# Patient Record
Sex: Female | Born: 1975 | Race: Black or African American | Hispanic: No | Marital: Single | Smoking: Current some day smoker
Health system: Southern US, Community
[De-identification: ages and names within clinical notes are randomized; demographics above are authoritative.]

## PROBLEM LIST (undated history)

## (undated) ENCOUNTER — Inpatient Hospital Stay (HOSPITAL_COMMUNITY): Payer: Self-pay

## (undated) DIAGNOSIS — R51 Headache: Secondary | ICD-10-CM

## (undated) DIAGNOSIS — A5901 Trichomonal vulvovaginitis: Secondary | ICD-10-CM

## (undated) DIAGNOSIS — A6000 Herpesviral infection of urogenital system, unspecified: Secondary | ICD-10-CM

## (undated) HISTORY — PX: DILATION AND CURETTAGE OF UTERUS: SHX78

---

## 1998-08-25 ENCOUNTER — Emergency Department (HOSPITAL_COMMUNITY): Admission: EM | Admit: 1998-08-25 | Discharge: 1998-08-25 | Payer: Self-pay | Admitting: Emergency Medicine

## 1998-08-31 ENCOUNTER — Inpatient Hospital Stay (HOSPITAL_COMMUNITY): Admission: AD | Admit: 1998-08-31 | Discharge: 1998-08-31 | Payer: Self-pay | Admitting: Obstetrics & Gynecology

## 1998-09-14 ENCOUNTER — Encounter: Payer: Self-pay | Admitting: Obstetrics

## 1998-09-14 ENCOUNTER — Inpatient Hospital Stay (HOSPITAL_COMMUNITY): Admission: AD | Admit: 1998-09-14 | Discharge: 1998-09-14 | Payer: Self-pay | Admitting: Obstetrics & Gynecology

## 1998-11-21 ENCOUNTER — Inpatient Hospital Stay (HOSPITAL_COMMUNITY): Admission: AD | Admit: 1998-11-21 | Discharge: 1998-11-21 | Payer: Self-pay | Admitting: *Deleted

## 1999-04-04 ENCOUNTER — Emergency Department (HOSPITAL_COMMUNITY): Admission: EM | Admit: 1999-04-04 | Discharge: 1999-04-04 | Payer: Self-pay | Admitting: Emergency Medicine

## 2000-10-12 ENCOUNTER — Emergency Department (HOSPITAL_COMMUNITY): Admission: EM | Admit: 2000-10-12 | Discharge: 2000-10-12 | Payer: Self-pay | Admitting: *Deleted

## 2000-10-21 ENCOUNTER — Emergency Department (HOSPITAL_COMMUNITY): Admission: EM | Admit: 2000-10-21 | Discharge: 2000-10-21 | Payer: Self-pay | Admitting: Emergency Medicine

## 2000-11-15 ENCOUNTER — Emergency Department (HOSPITAL_COMMUNITY): Admission: EM | Admit: 2000-11-15 | Discharge: 2000-11-15 | Payer: Self-pay | Admitting: Emergency Medicine

## 2001-01-24 ENCOUNTER — Inpatient Hospital Stay (HOSPITAL_COMMUNITY): Admission: AD | Admit: 2001-01-24 | Discharge: 2001-01-24 | Payer: Self-pay | Admitting: Obstetrics

## 2001-01-24 ENCOUNTER — Encounter: Payer: Self-pay | Admitting: Obstetrics

## 2001-01-24 ENCOUNTER — Encounter (INDEPENDENT_AMBULATORY_CARE_PROVIDER_SITE_OTHER): Payer: Self-pay | Admitting: *Deleted

## 2001-01-24 ENCOUNTER — Ambulatory Visit (HOSPITAL_COMMUNITY): Admission: AD | Admit: 2001-01-24 | Discharge: 2001-01-24 | Payer: Self-pay | Admitting: Obstetrics

## 2001-06-17 ENCOUNTER — Encounter: Payer: Self-pay | Admitting: *Deleted

## 2001-06-17 ENCOUNTER — Inpatient Hospital Stay (HOSPITAL_COMMUNITY): Admission: AD | Admit: 2001-06-17 | Discharge: 2001-06-17 | Payer: Self-pay | Admitting: *Deleted

## 2001-07-27 ENCOUNTER — Inpatient Hospital Stay (HOSPITAL_COMMUNITY): Admission: AD | Admit: 2001-07-27 | Discharge: 2001-07-27 | Payer: Self-pay | Admitting: Obstetrics and Gynecology

## 2001-07-27 ENCOUNTER — Encounter: Payer: Self-pay | Admitting: Obstetrics and Gynecology

## 2001-08-17 ENCOUNTER — Other Ambulatory Visit: Admission: RE | Admit: 2001-08-17 | Discharge: 2001-08-17 | Payer: Self-pay | Admitting: Obstetrics and Gynecology

## 2001-10-18 ENCOUNTER — Inpatient Hospital Stay (HOSPITAL_COMMUNITY): Admission: AD | Admit: 2001-10-18 | Discharge: 2001-10-18 | Payer: Self-pay | Admitting: Obstetrics and Gynecology

## 2001-11-26 ENCOUNTER — Encounter: Payer: Self-pay | Admitting: Obstetrics and Gynecology

## 2001-11-26 ENCOUNTER — Inpatient Hospital Stay (HOSPITAL_COMMUNITY): Admission: AD | Admit: 2001-11-26 | Discharge: 2001-11-26 | Payer: Self-pay | Admitting: Obstetrics and Gynecology

## 2001-12-23 ENCOUNTER — Inpatient Hospital Stay (HOSPITAL_COMMUNITY): Admission: AD | Admit: 2001-12-23 | Discharge: 2001-12-26 | Payer: Self-pay | Admitting: Obstetrics and Gynecology

## 2001-12-24 ENCOUNTER — Encounter (INDEPENDENT_AMBULATORY_CARE_PROVIDER_SITE_OTHER): Payer: Self-pay

## 2002-06-19 ENCOUNTER — Inpatient Hospital Stay (HOSPITAL_COMMUNITY): Admission: AD | Admit: 2002-06-19 | Discharge: 2002-06-19 | Payer: Self-pay | Admitting: Obstetrics and Gynecology

## 2002-06-20 ENCOUNTER — Encounter: Payer: Self-pay | Admitting: Obstetrics and Gynecology

## 2003-01-04 ENCOUNTER — Emergency Department (HOSPITAL_COMMUNITY): Admission: EM | Admit: 2003-01-04 | Discharge: 2003-01-04 | Payer: Self-pay | Admitting: Emergency Medicine

## 2003-02-27 ENCOUNTER — Emergency Department (HOSPITAL_COMMUNITY): Admission: EM | Admit: 2003-02-27 | Discharge: 2003-02-27 | Payer: Self-pay | Admitting: Family Medicine

## 2003-03-03 ENCOUNTER — Ambulatory Visit (HOSPITAL_COMMUNITY): Admission: RE | Admit: 2003-03-03 | Discharge: 2003-03-03 | Payer: Self-pay | Admitting: *Deleted

## 2003-06-05 ENCOUNTER — Emergency Department (HOSPITAL_COMMUNITY): Admission: EM | Admit: 2003-06-05 | Discharge: 2003-06-05 | Payer: Self-pay | Admitting: Emergency Medicine

## 2003-08-12 ENCOUNTER — Inpatient Hospital Stay (HOSPITAL_COMMUNITY): Admission: AD | Admit: 2003-08-12 | Discharge: 2003-08-12 | Payer: Self-pay | Admitting: Obstetrics and Gynecology

## 2003-10-01 ENCOUNTER — Inpatient Hospital Stay (HOSPITAL_COMMUNITY): Admission: AD | Admit: 2003-10-01 | Discharge: 2003-10-01 | Payer: Self-pay | Admitting: *Deleted

## 2003-10-06 ENCOUNTER — Emergency Department (HOSPITAL_COMMUNITY): Admission: EM | Admit: 2003-10-06 | Discharge: 2003-10-06 | Payer: Self-pay | Admitting: Family Medicine

## 2004-01-11 ENCOUNTER — Emergency Department (HOSPITAL_COMMUNITY): Admission: EM | Admit: 2004-01-11 | Discharge: 2004-01-11 | Payer: Self-pay | Admitting: Family Medicine

## 2004-07-13 ENCOUNTER — Emergency Department (HOSPITAL_COMMUNITY): Admission: EM | Admit: 2004-07-13 | Discharge: 2004-07-13 | Payer: Self-pay | Admitting: Emergency Medicine

## 2005-07-08 ENCOUNTER — Other Ambulatory Visit: Admission: RE | Admit: 2005-07-08 | Discharge: 2005-07-08 | Payer: Self-pay | Admitting: Obstetrics and Gynecology

## 2005-12-16 ENCOUNTER — Inpatient Hospital Stay (HOSPITAL_COMMUNITY): Admission: AD | Admit: 2005-12-16 | Discharge: 2005-12-18 | Payer: Self-pay | Admitting: Obstetrics and Gynecology

## 2006-12-05 ENCOUNTER — Inpatient Hospital Stay (HOSPITAL_COMMUNITY): Admission: AD | Admit: 2006-12-05 | Discharge: 2006-12-05 | Payer: Self-pay | Admitting: Obstetrics & Gynecology

## 2006-12-17 ENCOUNTER — Inpatient Hospital Stay (HOSPITAL_COMMUNITY): Admission: AD | Admit: 2006-12-17 | Discharge: 2006-12-17 | Payer: Self-pay | Admitting: Obstetrics & Gynecology

## 2007-11-22 ENCOUNTER — Inpatient Hospital Stay (HOSPITAL_COMMUNITY): Admission: AD | Admit: 2007-11-22 | Discharge: 2007-11-22 | Payer: Self-pay | Admitting: Obstetrics & Gynecology

## 2009-02-25 ENCOUNTER — Emergency Department (HOSPITAL_COMMUNITY): Admission: EM | Admit: 2009-02-25 | Discharge: 2009-02-25 | Payer: Self-pay | Admitting: Family Medicine

## 2009-08-19 ENCOUNTER — Emergency Department (HOSPITAL_COMMUNITY): Admission: EM | Admit: 2009-08-19 | Discharge: 2009-08-19 | Payer: Self-pay | Admitting: Emergency Medicine

## 2010-04-03 ENCOUNTER — Encounter: Payer: Self-pay | Admitting: *Deleted

## 2010-07-30 NOTE — Discharge Summary (Signed)
   NAMEVIRJEAN, Jamie Browning                             ACCOUNT NO.:  [id number]   MEDICAL RECORD NO.:  [id number]                   PATIENT TYPE:  INP   LOCATION:  9143                                 [facility]ILITY:  [facility]   PHYSICIAN:  James A. Ashley Royalty, M.D.             DATE OF BIRTH:  1975/09/21   DATE OF ADMISSION:  12/23/2001  DATE OF DISCHARGE:  12/26/2001                                 DISCHARGE SUMMARY   DISCHARGE DIAGNOSES:  1. Intrauterine pregnancy at 37-1/2 weeks' gestation.  2. Premature rupture of membranes.  3. Positive group B streptococcus status.  4. ________________ vertex.   OPERATION/SPECIAL PROCEDURES:  OB delivery.   CONSULTATIONS:  None.   DISCHARGE MEDICATIONS:  Motrin.   HISTORY AND PHYSICAL:  This is a 35 year old gravida 6 para 2-0-3-2 37-1/2  weeks' gestation.  Prenatal care was complicated by a positive group B  streptococcus culture and noncompliance with OB care.  The patient presented  with premature rupture of membranes at 6 p.m. the previous night.  She came  spontaneously to maternity admissions at 8 p.m.  Gross rupture of membranes  was documented.  For remainder of the history and physical, please see  chart.   HOSPITAL COURSE:  The patient was admitted to [facility] of  [place].  Admission laboratory studies were drawn.  The initial cervical  examination by the R.N. revealed 1 cm dilatation, long and high.  Intrauterine pressure catheter was subsequently placed and Pitocin infused.  The patient went on to labor and deliver on December 24, 2001.  Infant was a  6 pound 2 ounce female.  Apgars were 8 at one minute, 9 at five minutes,  sent to the newborn nursery.  Delivery was uncomplicated.  There was a  prolonged third stage of labor necessitating manual removal of the placenta.  The delivery was accomplished over an intact perineum.  There were no  lacerations.  Postpartum course was benign.  The patient was discharged on  the same  postpartum day, afebrile and in satisfactory condition.   ACCESSORY CLINICAL FINDINGS:  Hemoglobin and hematocrit on admission were  9.4 and 28.5, respectively.  Repeat values obtained December 26, 2001, were  8.1 and 24.1, respectively.   DISPOSITION:  The patient is to return to the office gynecology in four to  six weeks for postpartum evaluation.                                                James A. Ashley Royalty, M.D.    JAM/MEDQ  D:  03/06/2002  T:  03/07/2002  Job:  [id number]

## 2010-07-30 NOTE — Op Note (Signed)
[facility]  Patient:    Jamie Browning, Jamie Browning Visit Number: [id number] MRN: [id number]          Service Type: OBS Location: [facility] Attending Physician:  Tammi Sou Dictated by:   Bing Neighbors Clearance Coots, M.D. Proc. Date: 01/24/01 Admit Date:  01/24/2001                             Operative Report  PREOPERATIVE DIAGNOSIS:       First trimester spontaneous abortion, incomplete.  POSTOPERATIVE DIAGNOSIS:      First trimester spontaneous abortion, incomplete.  PROCEDURE:                    Suction evacuation and curettage.  SURGEON:                      Charles A. Clearance Coots, M.D.  ANESTHESIA:                   MAC with paracervical block.  ESTIMATED BLOOD LOSS:         100 ml.  COMPLICATIONS:                None.  SPECIMENS:                    Products of conception.  DESCRIPTION OF PROCEDURE:     The patient was brought to the operating room and, after satisfactory IV sedation, the legs were brought up in stirrups and the vagina was prepped and draped in the usual sterile fashion.  The urinary bladder was emptied of approximately 50 ml of clear urine.  Bimanual examination revealed the uterus to be midposition and approximately 11 weeks in size.  The cervix was open.  A sterile speculum was inserted in the vaginal vault and the cervix was isolated.  Products of conception could be seen extruding from the cervical os.  The anterior lip of the cervix was grasped with a ring forceps.  Paracervical block of approximately 20 ml of 2% Xylocaine was injected in each lateral fornix, with 10 ml in each lateral fornix for a total of 20 ml at the 3 and 9 oclock positions.  A #9 suction catheter was then easily introduced into the uterine cavity and all contents were evacuated.  The endometrial surface was then thoroughly curetted with a medium sharp curet, and no further products of conception were obtained.  The endometrial surface felt gritty all the  way around at the conclusion of the procedure.  The uterus contracted down quite well.  There was no active bleeding at the conclusion of the procedure.  All instruments were retired. The patient tolerated the procedure well and was transported to the recovery room in satisfactory condition. Dictated by:   Bing Neighbors Clearance Coots, M.D. Attending Physician:  Tammi Sou DD:  01/25/01 TD:  01/25/01 Job: [id number]
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
## 2010-12-15 LAB — GC/CHLAMYDIA PROBE AMP, GENITAL: GC Probe Amp, Genital: NEGATIVE

## 2010-12-15 LAB — WET PREP, GENITAL: Yeast Wet Prep HPF POC: NONE SEEN

## 2010-12-23 LAB — WET PREP, GENITAL
Clue Cells Wet Prep HPF POC: NONE SEEN
Yeast Wet Prep HPF POC: NONE SEEN

## 2010-12-23 LAB — GC/CHLAMYDIA PROBE AMP, GENITAL
Chlamydia, DNA Probe: NEGATIVE
Chlamydia, DNA Probe: NEGATIVE
GC Probe Amp, Genital: NEGATIVE

## 2010-12-23 LAB — URINALYSIS, ROUTINE W REFLEX MICROSCOPIC
Glucose, UA: NEGATIVE
Glucose, UA: NEGATIVE
Hgb urine dipstick: NEGATIVE
Ketones, ur: NEGATIVE
Leukocytes, UA: NEGATIVE
Protein, ur: NEGATIVE
Urobilinogen, UA: 1
pH: 8.5 — ABNORMAL HIGH

## 2010-12-23 LAB — URINE MICROSCOPIC-ADD ON

## 2010-12-23 LAB — CBC
HCT: 35.8 — ABNORMAL LOW
Hemoglobin: 12.4
MCHC: 35
MCV: 88.5
Platelets: 304
RDW: 13.1
RDW: 13.5

## 2010-12-23 LAB — POCT PREGNANCY, URINE
Operator id: 242691
Preg Test, Ur: POSITIVE

## 2010-12-23 LAB — HERPES SIMPLEX VIRUS CULTURE: Culture: DETECTED

## 2011-08-04 ENCOUNTER — Encounter (HOSPITAL_COMMUNITY): Payer: Self-pay | Admitting: Emergency Medicine

## 2011-08-04 ENCOUNTER — Emergency Department (INDEPENDENT_AMBULATORY_CARE_PROVIDER_SITE_OTHER)
Admission: EM | Admit: 2011-08-04 | Discharge: 2011-08-04 | Disposition: A | Discharge status: Home / Self Care | Payer: Medicaid Other | Source: Home / Self Care | Attending: Emergency Medicine | Admitting: Emergency Medicine

## 2011-08-04 ENCOUNTER — Emergency Department (INDEPENDENT_AMBULATORY_CARE_PROVIDER_SITE_OTHER): Payer: Medicaid Other

## 2011-08-04 DIAGNOSIS — S93409A Sprain of unspecified ligament of unspecified ankle, initial encounter: Secondary | ICD-10-CM

## 2011-08-04 MED ORDER — DICLOFENAC SODIUM 75 MG PO TBEC: 75.0000 mg | DELAYED_RELEASE_TABLET | Freq: Two times a day (BID) | ORAL | Status: DC

## 2011-08-04 NOTE — Discharge Instructions (Signed)
With discharge instructions. If pain persists after 7 days with weightbearing activities should return or followup with orthopedic Dr. Markus Jarvis
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
## 2011-08-08 ENCOUNTER — Emergency Department (HOSPITAL_COMMUNITY)
Admission: EM | Admit: 2011-08-08 | Discharge: 2011-08-08 | Disposition: A | Discharge status: Home / Self Care | Payer: Medicaid Other | Attending: Emergency Medicine | Admitting: Emergency Medicine

## 2011-08-08 ENCOUNTER — Encounter (HOSPITAL_COMMUNITY): Payer: Self-pay | Admitting: *Deleted

## 2011-08-08 DIAGNOSIS — M549 Dorsalgia, unspecified: Secondary | ICD-10-CM | POA: Insufficient documentation

## 2011-08-08 MED ORDER — IBUPROFEN 800 MG PO TABS
800.0000 mg | ORAL_TABLET | Freq: Once | ORAL | Status: AC
  Administered 2011-08-08: 800 mg via ORAL
  Filled 2011-08-08: qty 1

## 2011-08-08 MED ORDER — IBUPROFEN 800 MG PO TABS: 800.0000 mg | ORAL_TABLET | Freq: Three times a day (TID) | ORAL | Status: DC

## 2011-08-08 MED ORDER — CYCLOBENZAPRINE HCL 10 MG PO TABS
10.0000 mg | ORAL_TABLET | Freq: Once | ORAL | Status: AC
  Administered 2011-08-08: 10 mg via ORAL
  Filled 2011-08-08: qty 1

## 2011-08-08 MED ORDER — CYCLOBENZAPRINE HCL 10 MG PO TABS: 10.0000 mg | ORAL_TABLET | Freq: Two times a day (BID) | ORAL | Status: DC | PRN
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
## 2011-08-08 NOTE — Discharge Instructions (Signed)
Jamie Browning the pain you are having is coming from muscle pain. Use ice on the areas intermittently. Use ibuprofen 800 mg every 6 hours with 2x24 hours. For severe pain used to muscle relaxers but do not drive with this medication.  Follow up with a pcp from the list below.  Return if you have bowel or bladder problems or high fever.  Back Pain, Adult Back pain is very common. The pain often gets better over time. The cause of back pain is usually not dangerous. Most people can learn to manage their back pain on their own.  HOME CARE   Stay active. Start with short walks on flat ground if you can. Try to walk farther each day.   Do not sit, drive, or stand in one place for more than 30 minutes. Do not stay in bed.   Do not avoid exercise or work. Activity can help your back heal faster.   Be careful when you bend or lift an object. Bend at your knees, keep the object close to you, and do not twist.   Sleep on a firm mattress. Lie on your side, and bend your knees. If you lie on your back, put a pillow under your knees.   Only take medicines as told by your doctor.   Put ice on the injured area.   Put ice in a plastic bag.   Place a towel between your skin and the bag.   Leave the ice on for 15 to 20 minutes, 3 to 4 times a day for the first 2 to 3 days. After that, you can switch between ice and heat packs.   Ask your doctor about back exercises or massage.   Avoid feeling anxious or stressed. Find good ways to deal with stress, such as exercise.  GET HELP RIGHT AWAY IF:   Your pain does not go away with rest or medicine.   Your pain does not go away in 1 week.   You have new problems.   You do not feel well.   The pain spreads into your legs.   You cannot control when you poop (bowel movement) or pee (urinate).   Your arms or legs feel weak or lose feeling (numbness).   You feel sick to your stomach (nauseous) or throw up (vomit).   You have belly (abdominal) pain.    You feel like you may pass out (faint).  MAKE SURE YOU:   Understand these instructions.   Will watch your condition.   Will get help right away if you are not doing well or get worse.  Document Released: 08/17/2007 Document Revised: 02/17/2011 Document Reviewed: 07/19/2010 [facility] Patient Information 2012 [place], [place].Back Pain, Adult Back pain is very common. The pain often gets better over time. The cause of back pain is usually not dangerous. Most people can learn to manage their back pain on their own.  HOME CARE   Stay active. Start with short walks on flat ground if you can. Try to walk farther each day.   Do not sit, drive, or stand in one place for more than 30 minutes. Do not stay in bed.   Do not avoid exercise or work. Activity can help your back heal faster.   Be careful when you bend or lift an object. Bend at your knees, keep the object close to you, and do not twist.   Sleep on a firm mattress. Lie on your side, and bend your knees. If you lie  on your back, put a pillow under your knees.   Only take medicines as told by your doctor.   Put ice on the injured area.   Put ice in a plastic bag.   Place a towel between your skin and the bag.   Leave the ice on for 15 to 20 minutes, 3 to 4 times a day for the first 2 to 3 days. After that, you can switch between ice and heat packs.   Ask your doctor about back exercises or massage.   Avoid feeling anxious or stressed. Find good ways to deal with stress, such as exercise.  GET HELP RIGHT AWAY IF:   Your pain does not go away with rest or medicine.   Your pain does not go away in 1 week.   You have new problems.   You do not feel well.   The pain spreads into your legs.   You cannot control when you poop (bowel movement) or pee (urinate).   Your arms or legs feel weak or lose feeling (numbness).   You feel sick to your stomach (nauseous) or throw up (vomit).   You have belly (abdominal) pain.    You feel like you may pass out (faint).  MAKE SURE YOU:   Understand these instructions.   Will watch your condition.   Will get help right away if you are not doing well or get worse.  Document Released: 08/17/2007 Document Revised: 02/17/2011 Document Reviewed: 07/19/2010 [facility] Patient Information 2012 [place], [place].Back Exercises Back exercises help treat and prevent back injuries. The goal is to increase your strength in your belly (abdominal) and back muscles. These exercises can also help with flexibility. Start these exercises when told by your doctor. HOME CARE Back exercises include: Pelvic Tilt.  Lie on your back with your knees bent. Tilt your pelvis until the lower part of your back is against the floor. Hold this position 5 to 10 sec. Repeat this exercise 5 to 10 times.  Knee to Chest.  Pull 1 knee up against your chest and hold for 20 to 30 seconds. Repeat this with the other knee. This may be done with the other leg straight or bent, whichever feels better. Then, pull both knees up against your chest.  Sit-Ups or Curl-Ups.  Bend your knees 90 degrees. Start with tilting your pelvis, and do a partial, slow sit-up. Only lift your upper half 30 to 45 degrees off the floor. Take at least 2 to 3 seonds for each sit-up. Do not do sit-ups with your knees out straight. If partial sit-ups are difficult, simply do the above but with only tightening your belly (abdominal) muscles and holding it as told.  Hip-Lift.  Lie on your back with your knees flexed 90 degrees. Push down with your feet and shoulders as you raise your hips 2 inches off the floor. Hold for 10 seconds, repeat 5 to 10 times.  Back Arches.  Lie on your stomach. Prop yourself up on bent elbows. Slowly press on your hands, causing an arch in your low back. Repeat 3 to 5 times.  Shoulder-Lifts.  Lie face down with arms beside your body. Keep hips and belly pressed to floor as you slowly lift your head and  shoulders off the floor.  Do not overdo your exercises. Be careful in the beginning. Exercises may cause you some mild back discomfort. If the pain lasts for more than 15 minutes, stop the exercises until you see your doctor. Improvement  with exercise for back problems is slow.  Document Released: 04/02/2010 Document Revised: 02/17/2011 Document Reviewed: 04/02/2010 [facility] Patient Information 2012 [place], [place].  RESOURCE GUIDE  Dental Problems  Patients with Medicaid: [facility] [phone] [address]                                           [id number] [employer] [address] Phone:  [phone]                                                  Phone:  [phone]  If unable to pay or uninsured, contact:  [facility] or [facility]. to become qualified for the adult dental clinic.  Chronic Pain Problems Contact [facility]  [phone] Patients need to be referred by their primary care doctor.  Insufficient Money for Medicine Contact [employer]:  call "211" or [facility] 8577818084.  No Primary Care Doctor Call [facility]  [phone] Other agencies that provide inexpensive medical care    [facility]  [phone]    [facility]  [phone]    [facility]  [phone]    [facility]  [phone]    Planned Parenthood  [phone]    [facility]  [phone]  Psychological Services [facility]  [phone] [facility] Services  [phone] [facility]   [phone] (emergency services [phone])  Substance Abuse Resources Alcohol and Drug Services  612-210-3067 [facility] [phone] [facility] [phone] Floydene Flock [phone] Residential & Outpatient Substance Abuse Program  [phone]  Abuse/Neglect [facility] Child Abuse Hotline 4752139654 [facility] Child Abuse Hotline (325)761-0956  (After Hours)  Emergency Shelter [facility] Ministries [phone]  Maternity Homes Room at the [place] [facility] [place] 203-688-4185 [facility] 2056309304  MRSA Hotline #:   612-712-3209    [facility] Resources  [facility]     [employer]                          [facility] Dept. [address] [place]                       [address]      [address]  [place]                                                Cristobal Goldmann Phone:  [phone]                                   Phone:  [phone]  Phone:  [phone]  [facility] Mental Health Phone:  [phone]  [facility] Child Abuse Hotline 561-107-2488 [phone] (After Hours)

## 2011-08-11 NOTE — ED Provider Notes (Signed)
History     CSN: [id number]  Arrival date & time 08/08/11  8119   First MD Initiated Contact with Patient 08/08/11 1038      Chief Complaint  Patient presents with  . Back Pain    (Consider location/radiation/quality/duration/timing/severity/associated sxs/prior treatment) HPI  History reviewed. No pertinent past medical history.  History reviewed. No pertinent past surgical history.  No family history on file.  History  Substance Use Topics  . Smoking status: Never Smoker   . Smokeless tobacco: Not on file  . Alcohol Use: Yes    OB History    Grav Para Term Preterm Abortions TAB SAB Ect Mult Living                  Review of Systems  Allergies  Review of patient's allergies indicates no known allergies.  Home Medications   Current Outpatient Rx  Name Route Sig Dispense Refill  . DICLOFENAC SODIUM 75 MG PO TBEC Oral Take 75 mg by mouth 2 (two) times daily.    . IBUPROFEN 200 MG PO TABS Oral Take 400 mg by mouth every 6 (six) hours as needed. For pain    . CYCLOBENZAPRINE HCL 10 MG PO TABS Oral Take 1 tablet (10 mg total) by mouth 2 (two) times daily as needed for muscle spasms. 20 tablet 0  . IBUPROFEN 800 MG PO TABS Oral Take 1 tablet (800 mg total) by mouth 3 (three) times daily. 21 tablet 0    BP 115/67  Pulse 70  Temp(Src) 98.3 F (36.8 C) (Oral)  Resp 16  SpO2 98%  Physical Exam  ED Course  Procedures (including critical care time)  Labs Reviewed - No data to display No results found.   1. Back pain       MDM  Please see NP Crawford note for 5/27 ed visit        Suzi Roots, MD 08/11/11 [phone]

## 2011-08-13 ENCOUNTER — Encounter (HOSPITAL_COMMUNITY): Payer: Self-pay | Admitting: *Deleted

## 2011-08-13 ENCOUNTER — Emergency Department (HOSPITAL_COMMUNITY)
Admission: EM | Admit: 2011-08-13 | Discharge: 2011-08-14 | Disposition: A | Discharge status: Home / Self Care | Payer: Medicaid Other | Attending: Emergency Medicine | Admitting: Emergency Medicine

## 2011-08-13 DIAGNOSIS — T22139A Burn of first degree of unspecified upper arm, initial encounter: Secondary | ICD-10-CM | POA: Insufficient documentation

## 2011-08-13 DIAGNOSIS — X088XXA Exposure to other specified smoke, fire and flames, initial encounter: Secondary | ICD-10-CM | POA: Insufficient documentation

## 2011-08-13 DIAGNOSIS — T23109A Burn of first degree of unspecified hand, unspecified site, initial encounter: Secondary | ICD-10-CM | POA: Insufficient documentation

## 2011-08-13 DIAGNOSIS — T310 Burns involving less than 10% of body surface: Secondary | ICD-10-CM | POA: Insufficient documentation

## 2011-08-13 MED ORDER — TETANUS-DIPHTH-ACELL PERTUSSIS 5-2.5-18.5 LF-MCG/0.5 IM SUSP
0.5000 mL | Freq: Once | INTRAMUSCULAR | Status: AC
Start: 2011-08-13 — End: 2011-08-13
  Administered 2011-08-13: 0.5 mL via INTRAMUSCULAR
  Filled 2011-08-13: qty 0.5

## 2011-08-13 MED ORDER — HYDROCODONE-ACETAMINOPHEN 7.5-500 MG/15ML PO SOLN: 7.5000 mL | Freq: Four times a day (QID) | ORAL | Status: AC | PRN

## 2011-08-13 MED ORDER — SILVER SULFADIAZINE 1 % EX CREA
TOPICAL_CREAM | Freq: Once | CUTANEOUS | Status: AC
  Administered 2011-08-14: via TOPICAL
  Filled 2011-08-13: qty 85

## 2011-08-13 MED ORDER — SILVER SULFADIAZINE 1 % EX CREA: TOPICAL_CREAM | Freq: Every day | CUTANEOUS | Status: DC
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
## 2011-08-13 NOTE — Discharge Instructions (Signed)

## 2011-11-21 NOTE — ED Provider Notes (Signed)
History     CSN: [id number]  Arrival date & time 08/08/11  5784   First MD Initiated Contact with Patient 08/08/11 1038      Chief Complaint  Patient presents with  . Back Pain    (Consider location/radiation/quality/duration/timing/severity/associated sxs/prior treatment) Patient is a 36 y.o. female presenting with back pain. The history is provided by the patient. No language interpreter was used.  Back Pain  This is a new problem. The current episode started more than 2 days ago. The problem occurs daily. The problem has been gradually worsening. The pain is associated with no known injury. Pain location: paraspinal lumbar L. The quality of the pain is described as aching. The pain does not radiate. The pain is at a severity of 7/10. The pain is moderate. The symptoms are aggravated by bending and certain positions. Pertinent negatives include no fever, no numbness, no bowel incontinence, no perianal numbness, no bladder incontinence, no dysuria, no leg pain, no paresthesias, no paresis, no tingling and no weakness. She has tried analgesics for the symptoms. The treatment provided mild relief.    History reviewed. No pertinent past medical history.  History reviewed. No pertinent past surgical history.  No family history on file.  History  Substance Use Topics  . Smoking status: Never Smoker   . Smokeless tobacco: Not on file  . Alcohol Use: Yes    OB History    Grav Para Term Preterm Abortions TAB SAB Ect Mult Living                  Review of Systems  Constitutional: Negative.  Negative for fever.  HENT: Negative.   Eyes: Negative.   Respiratory: Negative.   Cardiovascular: Negative.   Gastrointestinal: Negative.  Negative for nausea, vomiting and bowel incontinence.  Genitourinary: Negative for bladder incontinence, dysuria, urgency, frequency, hematuria, flank pain and difficulty urinating.  Musculoskeletal: Positive for back pain. Negative for gait problem.    Neurological: Negative.  Negative for tingling, weakness, numbness and paresthesias.  Psychiatric/Behavioral: Negative.   All other systems reviewed and are negative.    Allergies  Review of patient's allergies indicates no known allergies.  Home Medications   Current Outpatient Rx  Name Route Sig Dispense Refill  . DICLOFENAC SODIUM 75 MG PO TBEC Oral Take 75 mg by mouth 2 (two) times daily as needed. For pain    . SILVER SULFADIAZINE 1 % EX CREA Topical Apply topically daily. 50 g 0    BP 115/67  Pulse 70  Temp 98.3 F (36.8 C) (Oral)  Resp 16  SpO2 98%  Physical Exam  Nursing note and vitals reviewed. Constitutional: She is oriented to person, place, and time. She appears well-developed and well-nourished.  HENT:  Head: Normocephalic and atraumatic.  Eyes: Conjunctivae normal and EOM are normal. Pupils are equal, round, and reactive to light.  Neck: Normal range of motion. Neck supple.  Cardiovascular: Normal rate.   Pulmonary/Chest: Effort normal.  Abdominal: Soft.  Musculoskeletal: Normal range of motion. She exhibits tenderness. She exhibits no edema.       L paraspinal lumbar tenderness  Neurological: She is alert and oriented to person, place, and time. She has normal reflexes.  Skin: Skin is warm and dry.  Psychiatric: She has a normal mood and affect.    ED Course  Procedures (including critical care time)  Labs Reviewed - No data to display No results found.   1. Back pain       MDM  L lower paraspinal pain x 3 days.  Better after ibuprofen/ice and flexeril in the ER.  RX for the same.  No sciatica, red flags or cauda equina symptoms.  Will followup with pcp of choice.  Return to the ER for worsening symptoms.       Remi Haggard, NP 11/25/11 1152

## 2011-11-26 NOTE — ED Provider Notes (Signed)
Medical screening examination/treatment/procedure(s) were performed by non-physician practitioner and as supervising physician I was immediately available for consultation/collaboration.   Katriona Schmierer E Benedicta Sultan, MD 11/26/11 1439 

## 2012-05-17 ENCOUNTER — Inpatient Hospital Stay (HOSPITAL_COMMUNITY)
Admission: AD | Admit: 2012-05-17 | Discharge: 2012-05-17 | Disposition: A | Discharge status: Home / Self Care | Payer: Medicaid Other | Source: Ambulatory Visit | Attending: Obstetrics & Gynecology | Admitting: Obstetrics & Gynecology

## 2012-05-17 ENCOUNTER — Encounter (HOSPITAL_COMMUNITY): Payer: Self-pay | Admitting: *Deleted

## 2012-05-17 ENCOUNTER — Inpatient Hospital Stay (HOSPITAL_COMMUNITY): Payer: Medicaid Other

## 2012-05-17 DIAGNOSIS — N938 Other specified abnormal uterine and vaginal bleeding: Secondary | ICD-10-CM | POA: Insufficient documentation

## 2012-05-17 HISTORY — DX: Headache: R51

## 2012-05-17 LAB — CBC
Hemoglobin: 11.7 g/dL — ABNORMAL LOW (ref 12.0–15.0)
MCHC: 32.7 g/dL (ref 30.0–36.0)
RBC: 4.2 MIL/uL (ref 3.87–5.11)

## 2012-05-17 LAB — POCT PREGNANCY, URINE: Preg Test, Ur: NEGATIVE

## 2012-05-17 MED ORDER — ESTROGENS CONJUGATED 0.3 MG PO TABS: 0.3000 mg | ORAL_TABLET | Freq: Every day | ORAL | Status: DC

## 2012-05-17 NOTE — MAU Provider Note (Signed)
History     CSN: [id number]  Arrival date and time: 05/17/12 1032   First Provider Initiated Contact with Patient 05/17/12 1124      Chief Complaint  Patient presents with  . Vaginal Bleeding   HPI This is a 37 y.o. female who presents with c/o irregular bleeding and spotting since being on DepoProvera. Has been on it for ayear and has had spotting throughout. Last dose was in January.  Denies major problems, just aggravated with spotting. Worried she has lost too much blood and is worried she has fibroids.  RN Note: Patient states she has had irregular bleeding for over a year while taking Depo. Last Depo was in January. States she bleeds some almost every day. Occasionally has cramping but none at this time.       OB History   Grav Para Term Preterm Abortions TAB SAB Ect Mult Living   7 4 4  3 2 1   4       Past Medical History  Diagnosis Date  . Headache     Past Surgical History  Procedure Laterality Date  . Dilation and curettage of uterus      History reviewed. No pertinent family history.  History  Substance Use Topics  . Smoking status: Never Smoker   . Smokeless tobacco: Not on file  . Alcohol Use: Yes     Comment: occasional    Allergies: No Known Allergies  Prescriptions prior to admission  Medication Sig Dispense Refill  . medroxyPROGESTERone (DEPO-PROVERA) 150 MG/ML injection Inject 150 mg into the muscle every 3 (three) months.      Marland Kitchen OVER THE COUNTER MEDICATION Take 2-4 capsules by mouth daily. Herbal supplement for weight loss      . OVER THE COUNTER MEDICATION Apply 1 application topically daily. Herbal enhancement cream        Review of Systems  Constitutional: Negative for fever, chills and malaise/fatigue.  Cardiovascular: Negative for chest pain.  Gastrointestinal: Negative for nausea, vomiting, abdominal pain, diarrhea and constipation.  Genitourinary: Negative for dysuria.  Neurological: Negative for dizziness.   Physical Exam    Blood pressure 128/73, pulse 70, temperature 98 F (36.7 C), temperature source Oral, resp. rate 20.  Physical Exam  Constitutional: She is oriented to person, place, and time. She appears well-developed and well-nourished. No distress.  Cardiovascular: Normal rate.   Respiratory: Effort normal.  GI: Soft. She exhibits no distension and no mass. There is no tenderness. There is no rebound and no guarding.  Genitourinary: Vagina normal and uterus normal. No vaginal discharge found.  Uterus small and nontender   Musculoskeletal: Normal range of motion.  Neurological: She is alert and oriented to person, place, and time.  Skin: Skin is warm and dry.  Psychiatric: She has a normal mood and affect.   Declines STD testing  MAU Course  Procedures  MDM US Transvaginal Non-ob  05/17/2012  *RADIOLOGY REPORT*  Clinical Data: Dysfunctional uterine bleeding. Depo-Provera administered 03/2012.  Vaginal bleeding every day per patient.  TRANSABDOMINAL AND TRANSVAGINAL ULTRASOUND OF PELVIS Technique:  Both transabdominal and transvaginal ultrasound examinations of the pelvis were performed. Transabdominal technique was performed for global imaging of the pelvis including uterus, ovaries, adnexal regions, and pelvic cul-de-sac.  It was necessary to proceed with endovaginal exam following the transabdominal exam to visualize the endometrium and adnexa.  Comparison:  None  Findings:  Uterus: Is anteverted and anteflexed and demonstrates a sagittal length of 9.6 cm, depth of 5.4 cm and  width of 7.5 cm.  A small focal fibroid is identified in the anterior midbody measuring 8 x 7 x 9 mm.  No other focal mural abnormalities are seen.  Endometrium: Appears thin and echogenic with a width of 2 mm.  A trace of fluid is noted in the fundal portion of the endometrial canal.  No areas of focal thickening or heterogeneity are seen.  Right ovary:  Has a normal appearance measuring 3.1 x 2.2 x 2.1 cm  Left ovary: Has a  normal appearance measuring 2.7 x 1.5 x 1.7 cm  Other findings: No pelvic fluid or separate adnexal masses are seen.  IMPRESSION: Small focal mural fibroid with sizes location as noted above. Normal endometrial stripe and ovaries.   Original Report Authenticated By: Rhodia Albright, M.D.     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 Biopsy since we know cause of bleeding       Will get appt in clnic for long term plans    Medication List    TAKE these medications       estrogens (conjugated) 0.3 MG tablet  Commonly known as:  PREMARIN  Take 1 tablet (0.3 mg total) by mouth daily. Take daily for 21 days then do not take for 7 days.     medroxyPROGESTERone 150 MG/ML injection  Commonly known as:  DEPO-PROVERA  Inject 150 mg into the muscle every 3 (three) months.     OVER THE COUNTER MEDICATION  Take 2-4 capsules by mouth daily. Herbal supplement for weight loss     OVER THE COUNTER MEDICATION  Apply 1 application topically daily. Herbal enhancement cream         Ottis Vacha 05/17/2012, 2:09 PM

## 2012-05-17 NOTE — MAU Note (Signed)
Patient states she has had irregular bleeding for over a year while taking Depo. Last Depo was in January. States she bleeds some almost every day. Occasionally has cramping but none at this time.

## 2012-05-17 NOTE — MAU Note (Signed)
Pt gets her depo @ [facility] in [place].  Last injection was end of January.

## 2012-05-21 NOTE — MAU Provider Note (Signed)
Attestation of Attending Supervision of Advanced Practitioner (CNM/NP): Evaluation and management procedures were performed by the Advanced Practitioner under my supervision and collaboration.  I have reviewed the Advanced Practitioner's note and chart, and I agree with the management and plan.  HARRAWAY-SMITH, CAROLYN 10:53 AM     

## 2013-05-22 ENCOUNTER — Inpatient Hospital Stay (HOSPITAL_COMMUNITY)
Admission: AD | Admit: 2013-05-22 | Discharge: 2013-05-22 | Discharge status: Against Medical Advice | Payer: Medicaid Other | Source: Ambulatory Visit | Attending: Obstetrics & Gynecology | Admitting: Obstetrics & Gynecology

## 2013-05-22 DIAGNOSIS — Z5320 Procedure and treatment not carried out because of patient's decision for unspecified reasons: Secondary | ICD-10-CM | POA: Insufficient documentation

## 2013-05-22 DIAGNOSIS — N938 Other specified abnormal uterine and vaginal bleeding: Secondary | ICD-10-CM | POA: Insufficient documentation

## 2013-05-22 DIAGNOSIS — N949 Unspecified condition associated with female genital organs and menstrual cycle: Secondary | ICD-10-CM | POA: Insufficient documentation

## 2013-05-22 NOTE — MAU Note (Signed)
Patient states she thinks she might have a tampon in for about one week. Patient states she is 2 weeks late for a Depo injection, [facility] but does not want to take it again. State she has bleeding all the time. States she was supposed to be called for an appointment for birth control pills but never got a call.

## 2013-05-22 NOTE — MAU Note (Signed)
Patient informed admission staff that she could not wait and left. State she will return this afternoon.

## 2013-05-25 ENCOUNTER — Encounter (HOSPITAL_COMMUNITY): Payer: Self-pay | Admitting: *Deleted

## 2013-05-25 ENCOUNTER — Inpatient Hospital Stay (HOSPITAL_COMMUNITY)
Admission: AD | Admit: 2013-05-25 | Discharge: 2013-05-25 | Disposition: A | Discharge status: Home / Self Care | Payer: Medicaid Other | Source: Ambulatory Visit | Attending: Obstetrics & Gynecology | Admitting: Obstetrics & Gynecology

## 2013-05-25 DIAGNOSIS — F17200 Nicotine dependence, unspecified, uncomplicated: Secondary | ICD-10-CM | POA: Insufficient documentation

## 2013-05-25 DIAGNOSIS — N760 Acute vaginitis: Secondary | ICD-10-CM

## 2013-05-25 DIAGNOSIS — B9689 Other specified bacterial agents as the cause of diseases classified elsewhere: Secondary | ICD-10-CM | POA: Insufficient documentation

## 2013-05-25 DIAGNOSIS — A499 Bacterial infection, unspecified: Secondary | ICD-10-CM

## 2013-05-25 HISTORY — DX: Herpesviral infection of urogenital system, unspecified: A60.00

## 2013-05-25 HISTORY — DX: Trichomonal vulvovaginitis: A59.01

## 2013-05-25 LAB — WET PREP, GENITAL
Trich, Wet Prep: NONE SEEN
Yeast Wet Prep HPF POC: NONE SEEN

## 2013-05-25 MED ORDER — METRONIDAZOLE 500 MG PO TABS: 500.0000 mg | ORAL_TABLET | Freq: Two times a day (BID) | ORAL | Status: DC

## 2013-05-25 NOTE — MAU Provider Note (Signed)
History     CSN: [id number]  Arrival date and time: 05/25/13 [id number]   First Provider Initiated Contact with Patient 05/25/13 [phone]      Chief Complaint  Patient presents with  . Foreign Body in Vagina   HPI  Ms. Jamie Browning is a 38 y.o. female who presents to MAU with suspicion for a "tampon stuck in her vagina". Pt is not 100% sure there is a tampon inside her, however she has an odor and feels it is likely related to this. She has had this happen in the past, where she developed an odor and went to have it checked out and it was a tampon stuck. Pt is currently on her menstrual cycle and uses depo provera for birthcontrol.   OB History   Grav Para Term Preterm Abortions TAB SAB Ect Mult Living   7 4 4  3 2 1   4       Past Medical History  Diagnosis Date  . HYQMVHQI(696.2Headache(784.0)     Past Surgical History  Procedure Laterality Date  . Dilation and curettage of uterus      History reviewed. No pertinent family history.  History  Substance Use Topics  . Smoking status: Current Some Day Smoker    Types: Cigars  . Smokeless tobacco: Not on file  . Alcohol Use: Yes     Comment: occasional    Allergies: No Known Allergies  Prescriptions prior to admission  Medication Sig Dispense Refill  . estrogens, conjugated, (PREMARIN) 0.3 MG tablet Take 1 tablet (0.3 mg total) by mouth daily. Take daily for 21 days then do not take for 7 days.  20 tablet  0  . medroxyPROGESTERone (DEPO-PROVERA) 150 MG/ML injection Inject 150 mg into the muscle every 3 (three) months.      Marland Kitchen. OVER THE COUNTER MEDICATION Take 2-4 capsules by mouth daily. Herbal supplement for weight loss      . OVER THE COUNTER MEDICATION Apply 1 application topically daily. Herbal enhancement cream       Results for orders placed during the hospital encounter of 05/25/13 (from the past 48 hour(s))  WET PREP, GENITAL     Status: Abnormal   Collection Time    05/25/13  9:00 AM      Result Value Ref Range   Yeast Wet Prep  HPF POC NONE SEEN  NONE SEEN   Trich, Wet Prep NONE SEEN  NONE SEEN   Clue Cells Wet Prep HPF POC MANY (*) NONE SEEN   WBC, Wet Prep HPF POC FEW (*) NONE SEEN   Comment: MANY BACTERIA SEEN    Review of Systems  Constitutional: Negative for fever and chills.  Gastrointestinal: Negative for nausea, vomiting and abdominal pain.  Genitourinary:       Vaginal odor No abnormal discharge   Physical Exam   Blood pressure 117/67, pulse 79, temperature 98.3 F (36.8 C), temperature source Oral, resp. rate 18.  Physical Exam  Constitutional: She is oriented to person, place, and time. She appears well-developed and well-nourished. No distress.  Neck: Normal range of motion.  GI: Soft.  Genitourinary:  Speculum exam: Vagina - Small amount of creamy, dark red blood noted in vaginal canal, mild odor Cervix - small active bleeding from cervix.  No sign of tampon or foreign body in the vagina  Bimanual exam: Cervix slightly open, no CMT  Uterus non tender, normal size Adnexa non tender, no masses bilaterally, no tampon or foreign body felt GC/Chlam, wet prep  done Chaperone present for exam.   Neurological: She is alert and oriented to person, place, and time.  Skin: Skin is warm. She is not diaphoretic.    MAU Course  Procedures None  MDM Wet prep GC- pending  Assessment and Plan   Assessment:  1. BV (bacterial vaginosis)    Plan:  Discharge home in stable condition RX: flagyl- no alcohol  Return to MAU as needed, if symptoms worsen   Jamie Hansen Rasch, NP  05/25/2013, 11:32 AM

## 2013-05-27 LAB — GC/CHLAMYDIA PROBE AMP
CT Probe RNA: NEGATIVE
GC PROBE AMP APTIMA: NEGATIVE

## 2013-06-03 NOTE — MAU Provider Note (Signed)
Attestation of Attending Supervision of Advanced Practitioner (CNM/NP): Evaluation and management procedures were performed by the Advanced Practitioner under my supervision and collaboration. I have reviewed the Advanced Practitioner's note and chart, and I agree with the management and plan.  Luc Shammas H. 6:03 AM
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
## 2014-01-13 ENCOUNTER — Encounter (HOSPITAL_COMMUNITY): Payer: Self-pay | Admitting: *Deleted

## 2015-08-04 ENCOUNTER — Inpatient Hospital Stay (HOSPITAL_COMMUNITY)
Admission: AD | Admit: 2015-08-04 | Discharge: 2015-08-04 | Disposition: A | Discharge status: Home / Self Care | Payer: Medicaid Other | Source: Ambulatory Visit | Attending: Family Medicine | Admitting: Family Medicine

## 2015-08-04 DIAGNOSIS — O26899 Other specified pregnancy related conditions, unspecified trimester: Secondary | ICD-10-CM

## 2015-08-04 DIAGNOSIS — R109 Unspecified abdominal pain: Secondary | ICD-10-CM | POA: Diagnosis not present

## 2015-08-04 DIAGNOSIS — N912 Amenorrhea, unspecified: Secondary | ICD-10-CM | POA: Insufficient documentation

## 2015-08-04 DIAGNOSIS — O9989 Other specified diseases and conditions complicating pregnancy, childbirth and the puerperium: Secondary | ICD-10-CM | POA: Diagnosis not present

## 2015-08-04 DIAGNOSIS — Z3201 Encounter for pregnancy test, result positive: Secondary | ICD-10-CM | POA: Insufficient documentation

## 2015-08-04 LAB — POCT PREGNANCY, URINE: Preg Test, Ur: POSITIVE — AB

## 2015-08-04 NOTE — MAU Note (Signed)
Has been on depo, has always had period while on depot (last was in Dec).  Now has not had bleeding in 3 months.  Did a home test, ? Results.  Has been having  Pain in abd, kind of all of her stomach.  No pain today.  Doesn't understand how she could be preg on the depo.  Explained the depo is good for 3 months, was due for depo in Feb/Mar. Has been gaining weight in her stomach.

## 2015-08-04 NOTE — MAU Provider Note (Signed)
[name] is a 40 y.o. [id number] at Unknown who presents to MAU today for pregnancy verification. The patient denies abdominal pain or vaginal bleeding today.   Patient voices to the charge nurse that she is having no pain, no concerns just wants to find out how far along she is. " I want to know if I have a dead baby".  This is not a desire pregnancy.    BP 129/71 mmHg  Pulse 84  Temp(Src) 98.1 F (36.7 C) (Oral)  Resp 16  Ht 5\' 2"  (1.575 m)  Wt 141 lb 9.6 oz (64.229 kg)  BMI 25.89 kg/m2  LMP   CONSTITUTIONAL: Well-developed, well-nourished female in no acute distress.  CARDIOVASCULAR: Regular heart rate RESPIRATORY: Normal effort NEUROLOGICAL: Alert and oriented to person, place, and time.  ABDOMEN: Soft, non tender. Gravid  SKIN: Skin is warm and dry. No rash noted. Not diaphoretic. No erythema. No pallor. PSYCH: Normal mood and affect. Normal behavior. Normal judgment and thought content.  MDM Medical Screen Exam Complete  A:  Amenorrhea Positive pregnancy test in MAU.    P: Discharge from MAU Patient may return to MAU as needed or if her condition were to change or worsen  Patient demands US today to determine gestational age. Discussed MAU use and reason for care here. US scheduled tomorrow morning at 0900 for viability; patient aware and agreeable.  [facility] for follow up following US.   Duane LopeJennifer I Malone Vanblarcom, NP  08/04/2015 2:35 PM

## 2015-08-05 ENCOUNTER — Encounter: Payer: Self-pay | Admitting: Family

## 2015-08-05 ENCOUNTER — Other Ambulatory Visit (HOSPITAL_COMMUNITY): Payer: Self-pay | Admitting: Obstetrics and Gynecology

## 2015-08-05 ENCOUNTER — Ambulatory Visit: Payer: Self-pay | Admitting: Family

## 2015-08-05 ENCOUNTER — Ambulatory Visit (HOSPITAL_COMMUNITY)
Admit: 2015-08-05 | Discharge: 2015-08-05 | Disposition: A | Discharge status: Home / Self Care | Payer: Medicaid Other | Attending: Obstetrics and Gynecology | Admitting: Obstetrics and Gynecology

## 2015-08-05 ENCOUNTER — Ambulatory Visit (HOSPITAL_COMMUNITY)
Admission: RE | Admit: 2015-08-05 | Discharge: 2015-08-05 | Disposition: A | Discharge status: Home / Self Care | Payer: Medicaid Other | Source: Ambulatory Visit | Attending: Obstetrics and Gynecology | Admitting: Obstetrics and Gynecology

## 2015-08-05 DIAGNOSIS — Z1389 Encounter for screening for other disorder: Secondary | ICD-10-CM

## 2015-08-05 DIAGNOSIS — Z3A18 18 weeks gestation of pregnancy: Secondary | ICD-10-CM

## 2015-08-05 DIAGNOSIS — R109 Unspecified abdominal pain: Principal | ICD-10-CM

## 2015-08-05 DIAGNOSIS — O26899 Other specified pregnancy related conditions, unspecified trimester: Secondary | ICD-10-CM

## 2015-08-05 DIAGNOSIS — O09522 Supervision of elderly multigravida, second trimester: Secondary | ICD-10-CM

## 2015-08-05 DIAGNOSIS — Z09 Encounter for follow-up examination after completed treatment for conditions other than malignant neoplasm: Secondary | ICD-10-CM

## 2015-08-05 NOTE — Progress Notes (Signed)
Pt here for ultrasound results only.  Seen in MAU yesterday requesting ultrasound.  Reports utilizing depo provera for past 9 years.  Reviewed ultrasound results with patient.  According to ultrasound patient is 18.2 wks IUP.  Desires abortion.

## 2016-03-10 ENCOUNTER — Encounter (HOSPITAL_COMMUNITY): Payer: Self-pay | Admitting: Family Medicine

## 2016-03-10 ENCOUNTER — Ambulatory Visit (HOSPITAL_COMMUNITY)
Admission: EM | Admit: 2016-03-10 | Discharge: 2016-03-10 | Disposition: A | Discharge status: Home / Self Care | Payer: Self-pay

## 2016-03-10 ENCOUNTER — Ambulatory Visit (HOSPITAL_COMMUNITY)
Admission: EM | Admit: 2016-03-10 | Discharge: 2016-03-10 | Disposition: A | Discharge status: Home / Self Care | Payer: Medicaid Other | Attending: Family Medicine | Admitting: Family Medicine

## 2016-03-10 DIAGNOSIS — H1089 Other conjunctivitis: Secondary | ICD-10-CM | POA: Diagnosis not present

## 2016-03-10 DIAGNOSIS — H5712 Ocular pain, left eye: Secondary | ICD-10-CM

## 2016-03-10 DIAGNOSIS — S0502XA Injury of conjunctiva and corneal abrasion without foreign body, left eye, initial encounter: Secondary | ICD-10-CM | POA: Diagnosis not present

## 2016-03-10 MED ORDER — HYDROCODONE-ACETAMINOPHEN 5-325 MG PO TABS
ORAL_TABLET | ORAL | Status: AC
  Filled 2016-03-10: qty 1

## 2016-03-10 MED ORDER — HYDROCODONE-ACETAMINOPHEN 5-325 MG PO TABS: 1.0000 | ORAL_TABLET | ORAL | 0 refills | Status: DC | PRN

## 2016-03-10 MED ORDER — HYDROCODONE-ACETAMINOPHEN 5-325 MG PO TABS
1.0000 | ORAL_TABLET | Freq: Once | ORAL | Status: AC
  Administered 2016-03-10: 1 via ORAL

## 2016-03-10 MED ORDER — POLYMYXIN B-TRIMETHOPRIM 10000-0.1 UNIT/ML-% OP SOLN: 1.0000 [drp] | OPHTHALMIC | 0 refills | Status: DC

## 2016-03-10 NOTE — ED Provider Notes (Signed)
CSN: [id number]     Arrival date & time 03/10/16  1201 History   First MD Initiated Contact with Patient 03/10/16 1410     Chief Complaint  Patient presents with  . Eye Problem   (Consider location/radiation/quality/duration/timing/severity/associated sxs/prior Treatment) 40 year old female complaining of left eye pain and watering for one day. She states recently she was looking at the sun and she had high irritation and slight sensitivity. She believes that mascara me a cotton in her eye and caused a scratch. She states her vision is normal. She took a hydrocodone for pain and it did not work.      Past Medical History:  Diagnosis Date  . Genital herpes   . Headache(784.0)   . Trichomonas vaginitis    Past Surgical History:  Procedure Laterality Date  . DILATION AND CURETTAGE OF UTERUS     History reviewed. No pertinent family history. Social History  Substance Use Topics  . Smoking status: Current Some Day Smoker    Types: Cigars  . Smokeless tobacco: Never Used  . Alcohol use Yes     Comment: occasional   OB History    Gravida Para Term Preterm AB Living   8 4 4   3 4    SAB TAB Ectopic Multiple Live Births   1 2           Review of Systems  Constitutional: Negative.   HENT: Negative.   Eyes: Positive for photophobia, pain, discharge and redness. Negative for visual disturbance.  Respiratory: Negative.   Neurological: Negative.     Allergies  Patient has no known allergies.  Home Medications   Prior to Admission medications   Medication Sig Start Date End Date Taking? Authorizing Provider  trimethoprim-polymyxin b (POLYTRIM) ophthalmic solution Place 1 drop into the left eye every 4 (four) hours. X 5 days 03/10/16   Hayden Rasmussenavid Yoshito Gaza, NP   Meds Ordered and Administered this Visit  Medications - No data to display  BP (!) 146/101   Pulse 89   Temp 98.4 F (36.9 C)   Resp 18   SpO2 98%  No data found.   Physical Exam  Constitutional: She appears  well-developed and well-nourished. No distress.  HENT:  Head: Normocephalic and atraumatic.  Eyes: EOM are normal. Right eye exhibits no discharge.  Left eye with clear watery drainage. There is lower conjunctiva erythema and mild swelling. Sclera mildly injected. Pupils small. Tetracaine ophthalmic drops applied to the left eye. Fluroscine stain and visualized under blue light. There is an ovoid area of stain uptake across the central cornea. Approximately 4 mm in length. No other uptake is seen. No other defects. No foreign bodies are seen. Eye was then irrigated copiously with eyewash.  Neck: Normal range of motion. Neck supple.  Cardiovascular: Normal rate.   Pulmonary/Chest: Effort normal.  Neurological: She is alert. No cranial nerve deficit.  Skin: Skin is warm and dry.  Psychiatric: She has a normal mood and affect.  Nursing note and vitals reviewed.   Urgent Care Course   Clinical Course     Procedures (including critical care time)  Labs Review Labs Reviewed - No data to display  Imaging Review No results found.   Visual Acuity Review  Right Eye Distance:   Left Eye Distance:   Bilateral Distance:    Right Eye Near:   Left Eye Near:    Bilateral Near:         MDM   1. Abrasion of left cornea,  initial encounter   2. Left eye pain   3. Other conjunctivitis of left eye    You have a scratch on the cornea workup part of the ball. This should heal up fine. Administer the antibiotic eyedrops as directed to your left eye. If you are not feeling any better or develop new symptoms by 2:00 tomorrow afternoon called the eye doctor above to be seen as soon as possible. Meds ordered this encounter  Medications  . trimethoprim-polymyxin b (POLYTRIM) ophthalmic solution    Sig: Place 1 drop into the left eye every 4 (four) hours. X 5 days    Dispense:  10 mL    Refill:  0    Order Specific Question:   Supervising Provider    Answer:   Linna HoffKINDL, JAMES D [phone][[id number]]  .  HYDROcodone-acetaminophen (NORCO/VICODIN) 5-325 MG tablet    Sig: Take 1 tablet by mouth every 4 (four) hours as needed.    Dispense:  10 tablet    Refill:  0    Order Specific Question:   Supervising Provider    Answer:   Linna HoffKINDL, JAMES D [[id number]]       Hayden Rasmussenavid Zebediah Beezley, NP 03/10/16 1442    Hayden Rasmussenavid Cayne Yom, NP 03/10/16 1444

## 2016-03-10 NOTE — Discharge Instructions (Signed)
You have a scratch on the cornea workup part of the ball. This should heal up fine. Administer the antibiotic eyedrops as directed to your left eye. If you are not feeling any better or develop new symptoms by 2:00 tomorrow afternoon called the eye doctor above to be seen as soon as possible.

## 2016-03-10 NOTE — ED Triage Notes (Signed)
Pt here for left eye drainage, swelling and redness. sts using new mascara.

## 2016-05-12 ENCOUNTER — Inpatient Hospital Stay (HOSPITAL_COMMUNITY)
Admission: AD | Admit: 2016-05-12 | Discharge: 2016-05-13 | Disposition: A | Discharge status: Home / Self Care | Payer: Medicaid Other | Source: Ambulatory Visit | Attending: Obstetrics & Gynecology | Admitting: Obstetrics & Gynecology

## 2016-05-12 DIAGNOSIS — F17290 Nicotine dependence, other tobacco product, uncomplicated: Secondary | ICD-10-CM | POA: Insufficient documentation

## 2016-05-12 DIAGNOSIS — T192XXA Foreign body in vulva and vagina, initial encounter: Secondary | ICD-10-CM

## 2016-05-12 DIAGNOSIS — Z711 Person with feared health complaint in whom no diagnosis is made: Secondary | ICD-10-CM | POA: Insufficient documentation

## 2016-05-13 ENCOUNTER — Encounter (HOSPITAL_COMMUNITY): Payer: Self-pay | Admitting: *Deleted

## 2016-05-13 DIAGNOSIS — T192XXA Foreign body in vulva and vagina, initial encounter: Secondary | ICD-10-CM

## 2016-05-13 NOTE — Discharge Instructions (Signed)
Vaginal Foreign Body A vaginal foreign body is any object that gets stuck or left inside the vagina. Vaginal foreign bodies left in the vagina for a long time can cause irritation and infection. In most cases, symptoms go away once the vaginal foreign body is found and removed. Rarely, a foreign object can break through the walls of the vagina and cause a serious infection inside the abdomen. What are the causes? The most common vaginal foreign bodies are:  Tampons.  Contraceptive devices.  Toilet tissue left in the vagina.  Small objects that were placed in the vagina out of curiosity and got stuck.  A result of sexual abuse. What are the signs or symptoms?  Light vaginal bleeding.  Blood-tinged vaginal fluid (discharge).  Vaginal discharge that smells bad.  Vaginal itching or burning.  Redness, swelling, or rash near the opening of the vagina.  Abdominal pain.  Fever.  Burning or frequent urination. How is this diagnosed? Your health care provider may be able to diagnose a vaginal foreign body based on the information you provide, your symptoms, and a physical exam. Your health care provider may also perform the following tests to check for infection:  A swab of the discharge to check under a microscope for bacteria (culture).  A urine culture.  An examination of the vagina with a small, lighted scope (vaginoscopy). How is this treated? In most cases, a vaginal foreign body can be easily removed and the symptoms usually go away very quickly. Other treatment may include:  If the vaginal foreign body is not easily removed, medicine may be given to make you go to sleep (general anesthesia) to have the object removed.  Emergency surgery may be necessary if an infection spreads through the walls of the vagina into the abdomen (acute abdomen). This is rare.  You may need to take antibiotic medicine if you have a vaginal or urinary tract infection. Follow these instructions  at home:  Take medicines only as directed by your health care provider.  If you were prescribed an antibiotic medicine, finish it all even if you start to feel better.  Do not have sex or use tampons until your health care provider says it is okay.  Do not douche or use vaginal rinses unless your health care provider recommends it.  Keep all follow-up visits as directed by your health care provider. This is important. Contact a health care provider if:  You have abdominal pain or burning pain when urinating.  You have a fever. Get help right away if:  You have heavy vaginal bleeding or discharge.  You have very bad abdominal pain. This information is not intended to replace advice given to you by your health care provider. Make sure you discuss any questions you have with your health care provider. Document Released: 07/15/2013 Document Revised: 08/06/2015 Document Reviewed: 12/28/2012 Elsevier Interactive Patient Education  2017 [employer]

## 2016-05-13 NOTE — MAU Note (Signed)
PT SAYS SHE USED 2  SMALL TAMPONS   - INSERTED ON 2-25.    THINKS  1 IS  STILL IN . NO ODOR, FEVER,  OR  D/C.

## 2016-05-13 NOTE — MAU Provider Note (Signed)
None     Chief Complaint:  STUCK TAMPON   Jamie Browning isNeomia Browning  41 y.o. [phone].  Patient's last menstrual period was 05/08/2016..  .  She presents complaining of STUCK TAMPON . She thinks she stuck 2 small tampons in on 2/25 and has only seen one come out.  No pain/odor/discharge.   Past Medical History:  Diagnosis Date  . Genital herpes   . Headache(784.0)   . Trichomonas vaginitis     Past Surgical History:  Procedure Laterality Date  . DILATION AND CURETTAGE OF UTERUS      No family history on file.  Social History  Substance Use Topics  . Smoking status: Current Some Day Smoker    Types: Cigars  . Smokeless tobacco: Never Used  . Alcohol use Yes     Comment: occasional    Allergies: No Known Allergies  Prescriptions Prior to Admission  Medication Sig Dispense Refill Last Dose  . HYDROcodone-acetaminophen (NORCO/VICODIN) 5-325 MG tablet Take 1 tablet by mouth every 4 (four) hours as needed. 10 tablet 0   . trimethoprim-polymyxin b (POLYTRIM) ophthalmic solution Place 1 drop into the left eye every 4 (four) hours. X 5 days 10 mL 0      Review of Systems   Constitutional: Negative for fever and chills Eyes: Negative for visual disturbances Respiratory: Negative for shortness of breath, dyspnea Cardiovascular: Negative for chest pain or palpitations  Gastrointestinal: Negative for vomiting, diarrhea and constipation Genitourinary: Negative for dysuria and urgency Musculoskeletal: Negative for back pain, joint pain, myalgias  Neurological: Negative for dizziness and headaches     Physical Exam   Blood pressure 125/88, pulse 69, temperature 98.3 F (36.8 C), temperature source Oral, resp. rate 18, weight 57.5 kg (126 lb 12 oz), last menstrual period 05/08/2016.  General: General appearance - alert, well appearing, and in no distress and oriented to person, place, and time Chest - normal respiratory effort Heart - normal rate and regular rhythm Abdomen -  nontender Pelvic - normal external genitalia, vulva, vagina, cervix, uterus and adnexa, NO TAMPON Extremities - no pedal edema noted   Labs: No results found for this or any previous visit (from the past 24 hour(s)). Imaging Studies:  No results found.   Assessment: NO RETAINED TAMPON  Plan: DC HOME  Jamie Browning

## 2016-05-28 ENCOUNTER — Encounter (HOSPITAL_COMMUNITY): Payer: Self-pay

## 2016-05-28 ENCOUNTER — Emergency Department (HOSPITAL_COMMUNITY)
Admission: EM | Admit: 2016-05-28 | Discharge: 2016-05-28 | Disposition: A | Discharge status: Home / Self Care | Payer: Medicaid Other | Attending: Emergency Medicine | Admitting: Emergency Medicine

## 2016-05-28 ENCOUNTER — Emergency Department (HOSPITAL_COMMUNITY): Payer: Medicaid Other

## 2016-05-28 DIAGNOSIS — S9001XA Contusion of right ankle, initial encounter: Secondary | ICD-10-CM

## 2016-05-28 DIAGNOSIS — F17290 Nicotine dependence, other tobacco product, uncomplicated: Secondary | ICD-10-CM | POA: Insufficient documentation

## 2016-05-28 DIAGNOSIS — S93401A Sprain of unspecified ligament of right ankle, initial encounter: Secondary | ICD-10-CM | POA: Insufficient documentation

## 2016-05-28 DIAGNOSIS — Y999 Unspecified external cause status: Secondary | ICD-10-CM | POA: Insufficient documentation

## 2016-05-28 DIAGNOSIS — Y9229 Other specified public building as the place of occurrence of the external cause: Secondary | ICD-10-CM | POA: Insufficient documentation

## 2016-05-28 DIAGNOSIS — W500XXA Accidental hit or strike by another person, initial encounter: Secondary | ICD-10-CM | POA: Insufficient documentation

## 2016-05-28 DIAGNOSIS — S80211A Abrasion, right knee, initial encounter: Secondary | ICD-10-CM | POA: Insufficient documentation

## 2016-05-28 DIAGNOSIS — S62393A Other fracture of third metacarpal bone, left hand, initial encounter for closed fracture: Secondary | ICD-10-CM | POA: Insufficient documentation

## 2016-05-28 DIAGNOSIS — Y939 Activity, unspecified: Secondary | ICD-10-CM | POA: Insufficient documentation

## 2016-05-28 DIAGNOSIS — Z23 Encounter for immunization: Secondary | ICD-10-CM | POA: Insufficient documentation

## 2016-05-28 MED ORDER — HYDROCODONE-ACETAMINOPHEN 5-325 MG PO TABS: 1.0000 | ORAL_TABLET | Freq: Four times a day (QID) | ORAL | 0 refills | Status: DC | PRN

## 2016-05-28 MED ORDER — TETANUS-DIPHTH-ACELL PERTUSSIS 5-2.5-18.5 LF-MCG/0.5 IM SUSP
0.5000 mL | Freq: Once | INTRAMUSCULAR | Status: AC
  Administered 2016-05-28: 0.5 mL via INTRAMUSCULAR
  Filled 2016-05-28: qty 0.5

## 2016-05-28 MED ORDER — NAPROXEN 500 MG PO TABS: 500.0000 mg | ORAL_TABLET | Freq: Two times a day (BID) | ORAL | 0 refills | Status: DC | PRN

## 2016-05-28 NOTE — Discharge Instructions (Signed)
You broke your left hand. Wear splint on your hand until you see the hand specialist. You've also sprained/bruised your knee and ankle, wear knee sleeve as needed for comfort, and use the ankle brace for at least 2 weeks for stabilization of the ankle. Ice and elevate the areas of pain throughout the day, using ice pack for no more than 20 minutes every hour.  Alternate between naprosyn and norco as directed as needed for pain relief. Do not drive or operate machinery with pain medication use. Call hand specialist follow up today or tomorrow to schedule followup appointment for ongoing management of your hand fracture in the next 5-7 days; follow up with your regular doctor in 1-2 weeks for recheck of your ankle and knee injuries. Return to the ER for changes or worsening symptoms.
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
## 2016-06-09 ENCOUNTER — Encounter (HOSPITAL_COMMUNITY): Payer: Self-pay

## 2017-04-12 ENCOUNTER — Ambulatory Visit (HOSPITAL_COMMUNITY)
Admission: EM | Admit: 2017-04-12 | Discharge: 2017-04-12 | Disposition: A | Discharge status: Home / Self Care | Payer: Medicaid Other | Attending: Family Medicine | Admitting: Family Medicine

## 2017-04-12 ENCOUNTER — Other Ambulatory Visit: Payer: Self-pay

## 2017-04-12 ENCOUNTER — Encounter (HOSPITAL_COMMUNITY): Payer: Self-pay | Admitting: Emergency Medicine

## 2017-04-12 DIAGNOSIS — T192XXA Foreign body in vulva and vagina, initial encounter: Secondary | ICD-10-CM | POA: Diagnosis not present

## 2017-04-12 DIAGNOSIS — N898 Other specified noninflammatory disorders of vagina: Secondary | ICD-10-CM | POA: Diagnosis not present

## 2017-04-12 DIAGNOSIS — X58XXXA Exposure to other specified factors, initial encounter: Secondary | ICD-10-CM | POA: Insufficient documentation

## 2017-04-12 DIAGNOSIS — F17290 Nicotine dependence, other tobacco product, uncomplicated: Secondary | ICD-10-CM | POA: Diagnosis not present

## 2017-04-12 DIAGNOSIS — N939 Abnormal uterine and vaginal bleeding, unspecified: Secondary | ICD-10-CM | POA: Diagnosis present

## 2017-04-12 DIAGNOSIS — Z3202 Encounter for pregnancy test, result negative: Secondary | ICD-10-CM | POA: Diagnosis not present

## 2017-04-12 LAB — POCT URINALYSIS DIP (DEVICE)
Glucose, UA: NEGATIVE mg/dL
KETONES UR: NEGATIVE mg/dL
LEUKOCYTES UA: NEGATIVE
Nitrite: POSITIVE — AB
Protein, ur: 30 mg/dL — AB
Specific Gravity, Urine: >=1.03 (ref 1.005–1.030)
Urobilinogen, UA: 1 mg/dL (ref 0.0–1.0)
pH: 5.5 (ref 5.0–8.0)

## 2017-04-12 MED ORDER — METRONIDAZOLE 1 % EX GEL: Freq: Every day | CUTANEOUS | 0 refills | Status: AC

## 2017-04-12 MED ORDER — MEGESTROL ACETATE 40 MG PO TABS: 40.0000 mg | ORAL_TABLET | Freq: Two times a day (BID) | ORAL | 0 refills | Status: AC

## 2017-04-12 NOTE — Discharge Instructions (Signed)
We removed the tampon from your vagina.  We are testing you for Gonorrhea, Chlamydia, Trichomonas, Yeast and Bacterial Vaginosis. We will call you if anything is positive and let you know if you require any further treatment. Please inform partners of any positive results.   Please take Megace 40 mg twice daily until bleeding stops.  Please follow women's clinic to discuss frequent bleeding/menstrual cycle.  Please return if symptoms not improving with treatment, development of fever, nausea, vomiting, abdominal pain.

## 2017-04-12 NOTE — ED Triage Notes (Signed)
[facility] a [facility] of abnormal menstrual bleeding and a vaginal odor.

## 2017-04-12 NOTE — ED Provider Notes (Signed)
[facility]    CSN: [id number] Arrival date & time: 04/12/17  1929     History   Chief Complaint Chief Complaint  Patient presents with  . Vaginal Bleeding    HPI Jamie Browning is a 42 y.o. female presenting with abnormal menstrual bleeding and a vaginal odor.  States that over the past 2 months she has had frequent menstrual cycles off and on that are occurring sooner than the 28 days.  They usually last 3-4 days and then stop and then return in about a week or 2.  Of recently she has also started to notice an odor that is associated with the bleeding.  States she does not notice that odor or any type of discharge when she is not on her period.  She still has her uterus and fallopian tubes.  Not on any form of birth control.  States she has had tampons stuck in her in the past.  Mild crampy abdominal pain that is relieved with Tylenol.  Urinary symptoms of dysuria, increased frequency, urgency.  States when she had been to [facility] ago and had a vaginal ultrasound they found a small fibroid.  She does not currently follow-up with OB/GYN.  HPI  Past Medical History:  Diagnosis Date  . Genital herpes   . Headache(784.0)   . Trichomonas vaginitis     There are no active problems to display for this patient.   Past Surgical History:  Procedure Laterality Date  . DILATION AND CURETTAGE OF UTERUS      OB History    Gravida Para Term Preterm AB Living   8 4 4   3 4    SAB TAB Ectopic Multiple Live Births   1 2             Home Medications    Prior to Admission medications   Medication Sig Start Date End Date Taking? Authorizing Provider  megestrol (MEGACE) 40 MG tablet Take 1 tablet (40 mg total) by mouth 2 (two) times daily for 5 days. 04/12/17 04/17/17  Zsofia Prout C, PA-C  metroNIDAZOLE (METROGEL) 1 % gel Apply topically daily for 7 days. 04/12/17 04/19/17  Lakhia Gengler, Junius Creamer, PA-C    Family History History reviewed. No pertinent family  history.  Social History Social History   Tobacco Use  . Smoking status: Current Some Day Smoker    Types: Cigars  . Smokeless tobacco: Never Used  Substance Use Topics  . Alcohol use: Yes    Comment: occasional  . Drug use: No     Allergies   Patient has no known allergies.   Review of Systems Review of Systems  Constitutional: Negative for fever.  Respiratory: Negative for shortness of breath.   Cardiovascular: Negative for chest pain.  Gastrointestinal: Positive for abdominal pain. Negative for diarrhea, nausea and vomiting.  Genitourinary: Positive for vaginal bleeding. Negative for dysuria, flank pain, genital sores, hematuria, menstrual problem, vaginal discharge and vaginal pain.  Musculoskeletal: Negative for back pain.  Skin: Negative for rash.  Neurological: Negative for dizziness, light-headedness and headaches.     Physical Exam Triage Vital Signs ED Triage Vitals  Enc Vitals Group     BP 04/12/17 1948 96/76     Pulse Rate 04/12/17 1948 89     Resp 04/12/17 1948 16     Temp 04/12/17 1948 98.1 F (36.7 C)     Temp Source 04/12/17 1948 Oral     SpO2 04/12/17 1948 98 %  Weight --      Height --      Head Circumference --      Peak Flow --      Pain Score 04/12/17 1949 0     Pain Loc --      Pain Edu? --      Excl. in GC? --    No data found.  Updated Vital Signs BP 96/76 (BP Location: Left Arm)   Pulse 89   Temp 98.1 F (36.7 C) (Oral)   Resp 16   LMP 04/12/2017 (Exact Date)   SpO2 98%    Physical Exam  Constitutional: She appears well-developed and well-nourished. No distress.  HENT:  Head: Normocephalic and atraumatic.  Eyes: Conjunctivae are normal.  Neck: Neck supple.  Cardiovascular: Normal rate and regular rhythm.  No murmur heard. Pulmonary/Chest: Effort normal and breath sounds normal. No respiratory distress.  Abdominal: Soft. There is no tenderness.  Genitourinary:  Genitourinary Comments: External exam with normal  treatment genitalia, no sources of bleeding.  Vaginal orifice with bright red blood present.  On speculum exam normal pink vaginal mucosa saturated tampon lying at the apex of the vaginal vault.  Tampon removed.  No CMT on bimanual.  Musculoskeletal: She exhibits no edema.  Neurological: She is alert.  Skin: Skin is warm and dry.  Psychiatric: She has a normal mood and affect.  Nursing note and vitals reviewed.    UC Treatments / Results  Labs (all labs ordered are listed, but only abnormal results are displayed) Labs Reviewed  POCT URINALYSIS DIP (DEVICE) - Abnormal; Notable for the following components:      Result Value   Bilirubin Urine SMALL (*)    Hgb urine dipstick MODERATE (*)    Protein, ur 30 (*)    Nitrite POSITIVE (*)    All other components within normal limits  URINE CULTURE  CERVICOVAGINAL ANCILLARY ONLY    EKG  EKG Interpretation None       Radiology No results found.  Procedures Procedures (including critical care time)  Medications Ordered in UC Medications - No data to display   Initial Impression / Assessment and Plan / UC Course  I have reviewed the triage vital signs and the nursing notes.  Pertinent labs & imaging results that were available during my care of the patient were reviewed by me and considered in my medical decision making (see chart for details).     Tampon removed from the vagina.  Pregnancy test negative.  Off-and-on bleeding seems to be recurrent and not an isolated occurrence.  Will provide Megace to take to stop bleeding.  We will also provide metronidazole to take for the next week for BV.  Vaginal swab obtained for other STDs and yeast.  Follow-up with [facility] to discuss abnormal uterine bleeding.  Final Clinical Impressions(s) / UC Diagnoses   Final diagnoses:  Vaginal bleeding  Retained tampon, initial encounter    ED Discharge Orders        Ordered    megestrol (MEGACE) 40 MG tablet  2 times daily      04/12/17 2028    metroNIDAZOLE (METROGEL) 1 % gel  Daily     04/12/17 2035       Controlled Substance Prescriptions [place] Controlled Substance Registry consulted? Not Applicable   Lew DawesWieters, Mubarak Bevens C, [place]PA-C 04/12/17 2043

## 2017-04-13 LAB — CERVICOVAGINAL ANCILLARY ONLY
BACTERIAL VAGINITIS: POSITIVE — AB
CHLAMYDIA, DNA PROBE: NEGATIVE
Candida vaginitis: NEGATIVE
NEISSERIA GONORRHEA: NEGATIVE
Trichomonas: POSITIVE — AB

## 2017-04-13 LAB — POCT PREGNANCY, URINE: Preg Test, Ur: NEGATIVE

## 2017-04-15 ENCOUNTER — Telehealth (HOSPITAL_COMMUNITY): Payer: Self-pay | Admitting: Internal Medicine

## 2017-04-15 LAB — URINE CULTURE

## 2017-04-15 MED ORDER — CEPHALEXIN 500 MG PO CAPS: 500.0000 mg | ORAL_CAPSULE | Freq: Two times a day (BID) | ORAL | 0 refills | Status: AC

## 2017-04-15 NOTE — Telephone Encounter (Signed)
Clinical staff, please let patient know that urine culture was positive for Klebsiella germ, sensitive to cephalexin. Prescription for cephalexin sent to the pharmacy of record, [facility] [address] [facility] [place]  Finish cephalexin.  Recheck or followup with PCP for further evaluation if symptoms are not improving.   Please also refer to previous result note of 2/1.  LM

## 2018-10-08 ENCOUNTER — Ambulatory Visit (HOSPITAL_COMMUNITY)
Admission: EM | Admit: 2018-10-08 | Discharge: 2018-10-08 | Disposition: A | Discharge status: Home / Self Care | Payer: Medicaid Other | Attending: Emergency Medicine | Admitting: Emergency Medicine

## 2018-10-08 ENCOUNTER — Encounter (HOSPITAL_COMMUNITY): Payer: Self-pay

## 2018-10-08 ENCOUNTER — Other Ambulatory Visit: Payer: Self-pay

## 2018-10-08 DIAGNOSIS — R1030 Lower abdominal pain, unspecified: Secondary | ICD-10-CM

## 2018-10-08 LAB — POCT URINALYSIS DIP (DEVICE)
Glucose, UA: NEGATIVE mg/dL
Ketones, ur: NEGATIVE mg/dL
Leukocytes,Ua: NEGATIVE
Nitrite: NEGATIVE
Protein, ur: 100 mg/dL — AB
Specific Gravity, Urine: >=1.03 (ref 1.005–1.030)
Urobilinogen, UA: 0.2 mg/dL (ref 0.0–1.0)
pH: 5.5 (ref 5.0–8.0)

## 2018-10-08 LAB — POCT PREGNANCY, URINE: Preg Test, Ur: NEGATIVE

## 2018-10-08 MED ORDER — ONDANSETRON HCL 4 MG PO TABS: 4.0000 mg | ORAL_TABLET | Freq: Four times a day (QID) | ORAL | 0 refills | Status: DC

## 2018-10-08 MED ORDER — IBUPROFEN 600 MG PO TABS: 600.0000 mg | ORAL_TABLET | Freq: Four times a day (QID) | ORAL | 0 refills | Status: DC | PRN

## 2018-10-08 NOTE — ED Provider Notes (Signed)
[facility]    CSN: [id number] Arrival date & time: 10/08/18  1811     History   Chief Complaint Chief Complaint  Patient presents with   Abdominal Pain    HPI Jamie Browning is a 43 y.o. female.   Patient presents with "cramping" lower abdominal pain x2 days.  She says it feels like her menstrual period is about to start.  She has taken over-the-counter Midol without relief.  She reports emesis x1 after she took the Midol on an empty stomach.  She denies fever, chills, diarrhea, dysuria, flank pain, vaginal discharge.  She reports she is sexually active and does not consistently use protection.  LMP: unsure but thinks it was in June.    The history is provided by the patient.    Past Medical History:  Diagnosis Date   Genital herpes    Headache(784.0)    Trichomonas vaginitis     There are no active problems to display for this patient.   Past Surgical History:  Procedure Laterality Date   DILATION AND CURETTAGE OF UTERUS      OB History    Gravida  8   Para  4   Term  4   Preterm      AB  3   Living  4     SAB  1   TAB  2   Ectopic      Multiple      Live Births               Home Medications    Prior to Admission medications   Medication Sig Start Date End Date Taking? Authorizing Provider  ibuprofen (ADVIL) 600 MG tablet Take 1 tablet (600 mg total) by mouth every 6 (six) hours as needed. 10/08/18   Mickie Bailate, Shajuan Musso H, NP  ondansetron (ZOFRAN) 4 MG tablet Take 1 tablet (4 mg total) by mouth every 6 (six) hours. 10/08/18   Mickie Bailate, Nyiah Pianka H, NP    Family History History reviewed. No pertinent family history.  Social History Social History   Tobacco Use   Smoking status: Current Some Day Smoker    Types: Cigars   Smokeless tobacco: Never Used  Substance Use Topics   Alcohol use: Yes    Comment: occasional   Drug use: No     Allergies   Patient has no known allergies.   Review of Systems Review of Systems    Constitutional: Negative for chills and fever.  HENT: Negative for ear pain and sore throat.   Eyes: Negative for pain and visual disturbance.  Respiratory: Negative for cough and shortness of breath.   Cardiovascular: Negative for chest pain and palpitations.  Gastrointestinal: Positive for abdominal pain and vomiting. Negative for constipation, diarrhea and nausea.  Genitourinary: Negative for dysuria, flank pain, hematuria, pelvic pain and vaginal discharge.  Musculoskeletal: Negative for arthralgias and back pain.  Skin: Negative for color change and rash.  Neurological: Negative for seizures and syncope.  All other systems reviewed and are negative.    Physical Exam Triage Vital Signs ED Triage Vitals  Enc Vitals Group     BP 10/08/18 1838 (!) 144/97     Pulse Rate 10/08/18 1838 60     Resp 10/08/18 1838 18     Temp 10/08/18 1838 98.3 F (36.8 C)     Temp Source 10/08/18 1838 Oral     SpO2 10/08/18 1838 99 %     Weight --  Height --      Head Circumference --      Peak Flow --      Pain Score 10/08/18 1840 10     Pain Loc --      Pain Edu? --      Excl. in [place]? --    No data found.  Updated Vital Signs BP (!) 144/97 (BP Location: Right Arm)    Pulse 60    Temp 98.3 F (36.8 C) (Oral)    Resp 18    LMP  (LMP Unknown)    SpO2 99%   Visual Acuity Right Eye Distance:   Left Eye Distance:   Bilateral Distance:    Right Eye Near:   Left Eye Near:    Bilateral Near:     Physical Exam Vitals signs and nursing note reviewed.  Constitutional:      General: She is not in acute distress.    Appearance: She is well-developed.     Comments: Well-appearing.   HENT:     Head: Normocephalic and atraumatic.  Eyes:     Conjunctiva/sclera: Conjunctivae normal.  Neck:     Musculoskeletal: Neck supple.  Cardiovascular:     Rate and Rhythm: Normal rate and regular rhythm.     Heart sounds: No murmur.  Pulmonary:     Effort: Pulmonary effort is normal. No  respiratory distress.     Breath sounds: Normal breath sounds.  Abdominal:     Palpations: Abdomen is soft.     Tenderness: There is no abdominal tenderness. There is no right CVA tenderness, left CVA tenderness, guarding or rebound.  Genitourinary:    General: Normal vulva.     Pubic Area: No rash.      Labia:        Right: No rash, tenderness or lesion.        Left: No rash, tenderness or lesion.      Vagina: Normal.     Cervix: Normal.     Uterus: Normal.      Adnexa: Right adnexa normal and left adnexa normal.  Skin:    General: Skin is warm and dry.     Findings: No rash.  Neurological:     Mental Status: She is alert.      UC Treatments / Results  Labs (all labs ordered are listed, but only abnormal results are displayed) Labs Reviewed  POCT URINALYSIS DIP (DEVICE) - Abnormal; Notable for the following components:      Result Value   Bilirubin Urine SMALL (*)    Hgb urine dipstick TRACE (*)    Protein, ur 100 (*)    All other components within normal limits  POC URINE PREG, ED  POCT PREGNANCY, URINE  CERVICOVAGINAL ANCILLARY ONLY    EKG   Radiology No results found.  Procedures Procedures (including critical care time)  Medications Ordered in UC Medications - No data to display  Initial Impression / Assessment and Plan / UC Course  I have reviewed the triage vital signs and the nursing notes.  Pertinent labs & imaging results that were available during my care of the patient were reviewed by me and considered in my medical decision making (see chart for details).   Lower abdominal pain.  Patient is well-appearing, nontoxic, no acute distress.  Exam unremarkable.  Treating today with Zofran as needed and ibuprofen as needed for discomfort.  Discussed with patient that she should take the ibuprofen with pain and not take any other over-the-counter  medications with this.  STD tests pending; discussed with patient that she should abstain from sex while  waiting for these test results and that if her test results come back positive, her sexual partners will need to be treated.  Patient declines STD treatment today and states she will wait for the test results to come back.  Strick instructions given to patient to go to the emergency department if her abdominal pain worsens.  Discussed that she can return here or follow-up with her PCP if she develops fever, chills, diarrhea, dysuria, back pain, vaginal discharge, pelvic pain, or other symptoms.     Final Clinical Impressions(s) / UC Diagnoses   Final diagnoses:  Lower abdominal pain     Discharge Instructions     You can take the prescribed ibuprofen as needed for your cramping abdominal pain.  Be sure to take this with food.  You can take the prescribed antinausea medicine Zofran as needed for nausea.    Your STD test are pending.  We will call you if they come back positive requiring treatment.  Do not have sex while you are waiting for these test results to come back.  If any test results come back positive, your sexual partners will need to be treated also.    Go to the emergency department if your abdominal pain becomes severe.    Return here or follow-up with your primary care provider if you develop fever, chills, diarrhea, difficulty with urination, back pain, vaginal discharge, pelvic pain, or other symptoms.        ED Prescriptions    Medication Sig Dispense Auth. Provider   ibuprofen (ADVIL) 600 MG tablet Take 1 tablet (600 mg total) by mouth every 6 (six) hours as needed. 30 tablet Sharion Balloon, NP   ondansetron (ZOFRAN) 4 MG tablet Take 1 tablet (4 mg total) by mouth every 6 (six) hours. 12 tablet Sharion Balloon, NP     Controlled Substance Prescriptions [place] Controlled Substance Registry consulted? Not Applicable   Sharion Balloon, NP 10/08/18 1931

## 2018-10-08 NOTE — Discharge Instructions (Addendum)
You can take the prescribed ibuprofen as needed for your cramping abdominal pain.  Be sure to take this with food.  You can take the prescribed antinausea medicine Zofran as needed for nausea.    Your STD test are pending.  We will call you if they come back positive requiring treatment.  Do not have sex while you are waiting for these test results to come back.  If any test results come back positive, your sexual partners will need to be treated also.    Go to the emergency department if your abdominal pain becomes severe.    Return here or follow-up with your primary care provider if you develop fever, chills, diarrhea, difficulty with urination, back pain, vaginal discharge, pelvic pain, or other symptoms.

## 2018-10-08 NOTE — ED Triage Notes (Signed)
Pt present severe abdominal pain that started two days ago.  Pt states she has taking OTC medication to help with the cramps but no relief.

## 2018-10-10 LAB — CERVICOVAGINAL ANCILLARY ONLY
Bacterial vaginitis: POSITIVE — AB
Chlamydia: NEGATIVE
Neisseria Gonorrhea: NEGATIVE
Trichomonas: POSITIVE — AB

## 2018-10-11 ENCOUNTER — Telehealth (HOSPITAL_COMMUNITY): Payer: Self-pay | Admitting: Emergency Medicine

## 2018-10-11 MED ORDER — METRONIDAZOLE 500 MG PO TABS: 500.0000 mg | ORAL_TABLET | Freq: Two times a day (BID) | ORAL | 0 refills | Status: AC

## 2018-10-11 NOTE — Telephone Encounter (Signed)
Bacterial vaginosis is positive. This was not treated at the urgent care visit.  Flagyl 500 mg BID x 7 days #14 no refills sent to patients pharmacy of choice.    Trichomonas is positive. Rx  for Flagyl  was sent to the pharmacy of record. Pt needs education to refrain from sexual intercourse for 7 days to give the medicine time to work. Sexual partners need to be notified and tested/treated. Condoms may reduce risk of reinfection. Recheck for further evaluation if symptoms are not improving.   Patient contacted and made aware of    results, all questions answered  

## 2019-06-10 ENCOUNTER — Other Ambulatory Visit: Payer: Self-pay

## 2019-06-10 ENCOUNTER — Ambulatory Visit (HOSPITAL_COMMUNITY)
Admission: EM | Admit: 2019-06-10 | Discharge: 2019-06-10 | Disposition: A | Discharge status: Home / Self Care | Payer: Medicaid Other | Attending: Family Medicine | Admitting: Family Medicine

## 2019-06-10 ENCOUNTER — Encounter (HOSPITAL_COMMUNITY): Payer: Self-pay

## 2019-06-10 DIAGNOSIS — L980 Pyogenic granuloma: Secondary | ICD-10-CM

## 2019-06-10 MED ORDER — DOXYCYCLINE HYCLATE 100 MG PO TABS
100.0000 mg | ORAL_TABLET | Freq: Once | ORAL | Status: AC
  Administered 2019-06-10: 100 mg via ORAL

## 2019-06-10 MED ORDER — DOXYCYCLINE HYCLATE 100 MG PO TABS
ORAL_TABLET | ORAL | Status: AC
  Filled 2019-06-10: qty 1

## 2019-06-10 MED ORDER — DOXYCYCLINE HYCLATE 100 MG PO CAPS: 100.0000 mg | ORAL_CAPSULE | Freq: Two times a day (BID) | ORAL | 0 refills | Status: DC

## 2019-06-10 NOTE — ED Triage Notes (Signed)
Pt c/o growth to posterior neck region for approx 1 week. Stalk-like growth approx 1 cm wide and approx 1.5 cm height observed with brown/red border and serous drainage from center. Pt denies pain on palpation.

## 2019-06-10 NOTE — Discharge Instructions (Addendum)
Try not to pick at the wound Return if not improved in a week

## 2019-06-10 NOTE — ED Provider Notes (Signed)
[place]    CSN: [id number] Arrival date & time: 06/10/19  1951      History   Chief Complaint Chief Complaint  Patient presents with  . Mass    HPI Jamie Browning is a 44 y.o. female.   HPI  Patient states she had a wound on the back of her neck.  It bothers her she picked it.  It bled a little bit and then got infected.  As it was healing and started to grow.  Now she has a lump at the site where the wound originally was.  She would like to have this looked at and possibly removed.  Past Medical History:  Diagnosis Date  . Genital herpes   . Headache(784.0)   . Trichomonas vaginitis     There are no problems to display for this patient.   Past Surgical History:  Procedure Laterality Date  . DILATION AND CURETTAGE OF UTERUS      OB History    Gravida  8   Para  4   Term  4   Preterm      AB  3   Living  4     SAB  1   TAB  2   Ectopic      Multiple      Live Births               Home Medications    Prior to Admission medications   Medication Sig Start Date End Date Taking? Authorizing Provider  doxycycline (VIBRAMYCIN) 100 MG capsule Take 1 capsule (100 mg total) by mouth 2 (two) times daily. 06/10/19   Raylene Everts, MD  ibuprofen (ADVIL) 600 MG tablet Take 1 tablet (600 mg total) by mouth every 6 (six) hours as needed. 10/08/18   Sharion Balloon, NP    Family History Family History  Problem Relation Age of Onset  . Healthy Mother   . Healthy Father     Social History Social History   Tobacco Use  . Smoking status: Current Some Day Smoker    Types: Cigars  . Smokeless tobacco: Never Used  Substance Use Topics  . Alcohol use: Yes    Comment: occasional  . Drug use: No     Allergies   Patient has no known allergies.   Review of Systems Review of Systems  Skin: Positive for wound.     Physical Exam Triage Vital Signs ED Triage Vitals  Enc Vitals Group     BP 06/10/19 2007 (!) 156/92     Pulse  Rate 06/10/19 2007 89     Resp 06/10/19 2007 16     Temp 06/10/19 2007 98.2 F (36.8 C)     Temp Source 06/10/19 2007 Oral     SpO2 06/10/19 2007 99 %     Weight --      Height --      Head Circumference --      Peak Flow --      Pain Score 06/10/19 2008 0     Pain Loc --      Pain Edu? --      Excl. in [place]? --    No data found.  Updated Vital Signs BP (!) 156/92 (BP Location: Right Arm)   Pulse 89   Temp 98.2 F (36.8 C) (Oral)   Resp 16   LMP 05/27/2019 (Approximate)   SpO2 99%   Visual Acuity Right Eye Distance:   Left Eye  Distance:   Bilateral Distance:    Right Eye Near:   Left Eye Near:    Bilateral Near:     Physical Exam Constitutional:      General: She is not in acute distress.    Appearance: She is well-developed and normal weight.  HENT:     Head: Normocephalic and atraumatic.     Mouth/Throat:     Comments: Mask in place Eyes:     Conjunctiva/sclera: Conjunctivae normal.     Pupils: Pupils are equal, round, and reactive to light.  Cardiovascular:     Rate and Rhythm: Normal rate.  Pulmonary:     Effort: Pulmonary effort is normal. No respiratory distress.  Musculoskeletal:        General: Normal range of motion.     Cervical back: Normal range of motion.  Skin:    General: Skin is warm and dry.     Comments: At the base of the neck over the C7 spinous process there is a pedunculated mass of granulation tissue that is friable, approximately 12 mm across.  It is thoroughly coated with silver nitrate, covered with dry dressing  Neurological:     Mental Status: She is alert.      UC Treatments / Results  Labs (all labs ordered are listed, but only abnormal results are displayed) Labs Reviewed - No data to display  EKG   Radiology No results found.  Procedures Procedures (including critical care time)  Medications Ordered in UC Medications  doxycycline (VIBRA-TABS) tablet 100 mg (100 mg Oral Given 06/10/19 2032)    Initial  Impression / Assessment and Plan / UC Course  I have reviewed the triage vital signs and the nursing notes.  Pertinent labs & imaging results that were available during my care of the patient were reviewed by me and considered in my medical decision making (see chart for details).     Expectations for healing were discussed with patient.  She may need more than one treatment.  She may need to have this surgically removed.   Final Clinical Impressions(s) / UC Diagnoses   Final diagnoses:  None     Discharge Instructions     Try not to pick at the wound Return if not improved in a week   ED Prescriptions    Medication Sig Dispense Auth. Provider   doxycycline (VIBRAMYCIN) 100 MG capsule Take 1 capsule (100 mg total) by mouth 2 (two) times daily. 14 capsule Eustace Moore, MD     PDMP not reviewed this encounter.   Eustace Moore, MD 06/10/19 [phone]

## 2019-07-05 ENCOUNTER — Ambulatory Visit (HOSPITAL_COMMUNITY)
Admission: EM | Admit: 2019-07-05 | Discharge: 2019-07-05 | Disposition: A | Discharge status: Home / Self Care | Payer: Medicaid Other

## 2019-07-05 ENCOUNTER — Encounter (HOSPITAL_COMMUNITY): Payer: Self-pay

## 2019-07-05 ENCOUNTER — Other Ambulatory Visit: Payer: Self-pay

## 2019-07-05 DIAGNOSIS — L980 Pyogenic granuloma: Secondary | ICD-10-CM | POA: Diagnosis not present

## 2019-07-05 NOTE — Discharge Instructions (Addendum)
Given the location and risks, I feel this would be best handled in a surgical office and not in an urgent care setting.  Please call the [place] surgery office for consultation.

## 2019-07-05 NOTE — ED Triage Notes (Signed)
Patient following up for growth on back. Hoping to have it excised this visit.

## 2019-07-05 NOTE — ED Provider Notes (Signed)
[facility]    CSN: [id number] Arrival date & time: 07/05/19  2409      History   Chief Complaint Chief Complaint  Patient presents with  . Abscess    HPI Jamie Browning is a 44 y.o. female.   Patient presents for reevaluation of a mass on the back of her neck.  She was seen on 06/10/2019 diagnosed with a pyogenic granuloma.  At that time pedunculated stalk was 12 mm in width.  She was treated with doxycycline and silver topical creams.  She reports she was advised that it may be cut off at that time by the provider she saw that day.  However deferred due to cosmetic reasons.  She is reporting today for possible removal.  She reports that it is been irritating a little bit but is not been overly painful.  She reports it does itch at times.  There has been no drainage.  Denies fever or chills.     Past Medical History:  Diagnosis Date  . Genital herpes   . Headache(784.0)   . Trichomonas vaginitis     There are no problems to display for this patient.   Past Surgical History:  Procedure Laterality Date  . DILATION AND CURETTAGE OF UTERUS      OB History    Gravida  8   Para  4   Term  4   Preterm      AB  3   Living  4     SAB  1   TAB  2   Ectopic      Multiple      Live Births               Home Medications    Prior to Admission medications   Medication Sig Start Date End Date Taking? Authorizing Provider  doxycycline (VIBRAMYCIN) 100 MG capsule Take 1 capsule (100 mg total) by mouth 2 (two) times daily. 06/10/19   Eustace Moore, MD  ibuprofen (ADVIL) 600 MG tablet Take 1 tablet (600 mg total) by mouth every 6 (six) hours as needed. 10/08/18   Mickie Bail, NP    Family History Family History  Problem Relation Age of Onset  . Healthy Mother   . Healthy Father     Social History Social History   Tobacco Use  . Smoking status: Current Some Day Smoker    Types: Cigars  . Smokeless tobacco: Never Used  Substance Use  Topics  . Alcohol use: Yes    Comment: occasional  . Drug use: No     Allergies   Patient has no known allergies.   Review of Systems Review of Systems   Physical Exam Triage Vital Signs ED Triage Vitals  Enc Vitals Group     BP 07/05/19 0851 115/71     Pulse Rate 07/05/19 0851 97     Resp 07/05/19 0851 14     Temp 07/05/19 0851 98.5 F (36.9 C)     Temp Source 07/05/19 0851 Oral     SpO2 07/05/19 0851 100 %     Weight --      Height --      Head Circumference --      Peak Flow --      Pain Score 07/05/19 0848 0     Pain Loc --      Pain Edu? --      Excl. in [place]? --    No data found.  Updated Vital Signs BP 115/71 (BP Location: Right Arm)   Pulse 97   Temp 98.5 F (36.9 C) (Oral)   Resp 14   LMP 06/14/2019 (Within Days)   SpO2 100%   Visual Acuity Right Eye Distance:   Left Eye Distance:   Bilateral Distance:    Right Eye Near:   Left Eye Near:    Bilateral Near:     Physical Exam Constitutional:      Appearance: Normal appearance.  Skin:    Comments: 2.5 cm in diameter granulomatous mass.  With a 13 mm stalk connecting over the midline C7 region of the cervical spine.  No bleeding however mildly friable.  Mildly tender to touch.  No surrounding erythema  Neurological:     Mental Status: She is alert.        UC Treatments / Results  Labs (all labs ordered are listed, but only abnormal results are displayed) Labs Reviewed - No data to display  EKG   Radiology No results found.  Procedures Procedures (including critical care time)  Medications Ordered in UC Medications - No data to display  Initial Impression / Assessment and Plan / UC Course  I have reviewed the triage vital signs and the nursing notes.  Pertinent labs & imaging results that were available during my care of the patient were reviewed by me and considered in my medical decision making (see chart for details).     #Pyogenic granuloma-neck Patient is a  44 year old female presenting with pyogenic granuloma of midline neck.  Given location and size we discussed that urgent care may not be the best setting to have this surgically excised to ensure complete removal.  Discussed that underlying structures and risk of bleeding give me pause about performing this in urgent care.  Discussed that I would recommend following up with [facility] surgery as I believe it would be best managed in a surgical office.  Patient agrees to this.  Given good relief with silver-based creams will manage with silver-based creams and supplied surgical consultation number.  Patient verbalized agreement understanding this plan.   Final Clinical Impressions(s) / UC Diagnoses   Final diagnoses:  Pyogenic granuloma of skin     Discharge Instructions     Given the location and risks, I feel this would be best handled in a surgical office and not in an urgent care setting.  Please call the [place] surgery office for consultation.     ED Prescriptions    None     PDMP not reviewed this encounter.   Purnell Shoemaker, PA-C 07/05/19 [phone]

## 2019-07-26 ENCOUNTER — Other Ambulatory Visit: Payer: Self-pay | Admitting: General Surgery

## 2020-12-11 ENCOUNTER — Encounter (HOSPITAL_COMMUNITY): Payer: Self-pay

## 2020-12-11 ENCOUNTER — Other Ambulatory Visit: Payer: Self-pay

## 2020-12-11 ENCOUNTER — Ambulatory Visit (HOSPITAL_COMMUNITY)
Admission: EM | Admit: 2020-12-11 | Discharge: 2020-12-11 | Disposition: A | Discharge status: Home / Self Care | Payer: Medicaid Other | Attending: Emergency Medicine | Admitting: Emergency Medicine

## 2020-12-11 DIAGNOSIS — F17200 Nicotine dependence, unspecified, uncomplicated: Secondary | ICD-10-CM | POA: Diagnosis not present

## 2020-12-11 DIAGNOSIS — T148XXA Other injury of unspecified body region, initial encounter: Secondary | ICD-10-CM

## 2020-12-11 MED ORDER — NAPROXEN 500 MG PO TABS: 500.0000 mg | ORAL_TABLET | Freq: Two times a day (BID) | ORAL | 0 refills | Status: DC

## 2020-12-11 NOTE — Discharge Instructions (Addendum)
Take the naproxen twice a day as needed for pain.  You can also take Tylenol as needed for pain.   You can use heat, ice, or alternate between heat and ice for comfort.  You may also try lidocaine patches, IcyHot, Biofreeze, Aspercreme, or Voltaren gel as needed for pain relief.  It is recommended that you quit smoking.  Return or go to the Emergency Department if symptoms worsen or do not improve in the next few days.

## 2020-12-11 NOTE — ED Triage Notes (Signed)
Pt presents with back pain since waking up this morning.

## 2020-12-11 NOTE — ED Provider Notes (Signed)
[facility]    CSN: [id number] Arrival date & time: 12/11/20  1433      History   Chief Complaint Chief Complaint  Patient presents with   Back Pain    HPI Jamie Browning is a 45 y.o. female.   Patient here for evaluation of pain on her left side that has been ongoing since this morning.  Reports unsure where pain is exactly but states that it is "inside".  Reports taking ibuprofen with minimal symptom relief.  Denies any trauma, injury, or other precipitating event.  Denies any specific alleviating or aggravating factors.  Denies any fevers, chest pain, shortness of breath, N/V/D, numbness, tingling, weakness, abdominal pain, or headaches.    The history is provided by the patient.  Back Pain Associated symptoms: no abdominal pain and no dysuria    Past Medical History:  Diagnosis Date   Genital herpes    Headache(784.0)    Trichomonas vaginitis     There are no problems to display for this patient.   Past Surgical History:  Procedure Laterality Date   DILATION AND CURETTAGE OF UTERUS      OB History     Gravida  8   Para  4   Term  4   Preterm      AB  3   Living  4      SAB  1   IAB  2   Ectopic      Multiple      Live Births               Home Medications    Prior to Admission medications   Medication Sig Start Date End Date Taking? Authorizing Provider  naproxen (NAPROSYN) 500 MG tablet Take 1 tablet (500 mg total) by mouth 2 (two) times daily. 12/11/20  Yes Ivette Loyal, NP  doxycycline (VIBRAMYCIN) 100 MG capsule Take 1 capsule (100 mg total) by mouth 2 (two) times daily. 06/10/19   Eustace Moore, MD  ibuprofen (ADVIL) 600 MG tablet Take 1 tablet (600 mg total) by mouth every 6 (six) hours as needed. 10/08/18   Mickie Bail, NP    Family History Family History  Problem Relation Age of Onset   Healthy Mother    Healthy Father     Social History Social History   Tobacco Use   Smoking status: Some Days     Types: Cigars   Smokeless tobacco: Never  Substance Use Topics   Alcohol use: Yes    Comment: occasional   Drug use: No     Allergies   Patient has no known allergies.   Review of Systems Review of Systems  Gastrointestinal:  Negative for abdominal pain, diarrhea, nausea and vomiting.  Genitourinary:  Negative for dysuria, flank pain and urgency.  Musculoskeletal:  Positive for back pain.  All other systems reviewed and are negative.   Physical Exam Triage Vital Signs ED Triage Vitals  Enc Vitals Group     BP 12/11/20 1456 104/67     Pulse Rate 12/11/20 1456 90     Resp 12/11/20 1456 17     Temp 12/11/20 1456 98.3 F (36.8 C)     Temp Source 12/11/20 1456 Oral     SpO2 12/11/20 1456 99 %     Weight --      Height --      Head Circumference --      Peak Flow --  Pain Score 12/11/20 1500 9     Pain Loc --      Pain Edu? --      Excl. in GC? --    No data found.  Updated Vital Signs BP 104/67 (BP Location: Left Arm)   Pulse 90   Temp 98.3 F (36.8 C) (Oral)   Resp 17   LMP 11/12/2020   SpO2 99%   Visual Acuity Right Eye Distance:   Left Eye Distance:   Bilateral Distance:    Right Eye Near:   Left Eye Near:    Bilateral Near:     Physical Exam Vitals and nursing note reviewed.  Constitutional:      General: She is not in acute distress.    Appearance: Normal appearance. She is not ill-appearing, toxic-appearing or diaphoretic.  HENT:     Head: Normocephalic and atraumatic.  Eyes:     Conjunctiva/sclera: Conjunctivae normal.  Cardiovascular:     Rate and Rhythm: Normal rate.     Pulses: Normal pulses.     Heart sounds: Normal heart sounds.  Pulmonary:     Effort: Pulmonary effort is normal.     Breath sounds: Normal breath sounds.  Chest:     Chest wall: No deformity, swelling or tenderness.  Abdominal:     General: Abdomen is flat.     Tenderness: There is no right CVA tenderness or left CVA tenderness.  Musculoskeletal:         General: Normal range of motion.     Cervical back: Normal range of motion. No tenderness or bony tenderness. No pain with movement.     Thoracic back: No spasms, tenderness or bony tenderness.  Skin:    General: Skin is warm and dry.  Neurological:     General: No focal deficit present.     Mental Status: She is alert and oriented to person, place, and time.  Psychiatric:        Mood and Affect: Mood normal.     UC Treatments / Results  Labs (all labs ordered are listed, but only abnormal results are displayed) Labs Reviewed - No data to display  EKG   Radiology No results found.  Procedures Procedures (including critical care time)  Medications Ordered in UC Medications - No data to display  Initial Impression / Assessment and Plan / UC Course  I have reviewed the triage vital signs and the nursing notes.  Pertinent labs & imaging results that were available during my care of the patient were reviewed by me and considered in my medical decision making (see chart for details).    Assessment negative for red flags or concerns.  Likely muscle strain.  Recommend naproxen twice daily.  May take Tylenol as needed.  May use heat, ice, or other OTC medication as described in discharge instructions.  It is recommended that patient quit smoking and was given instructions on how to quit.  Recommend following up with primary care for reevaluation of soon as possible. Final Clinical Impressions(s) / UC Diagnoses   Final diagnoses:  Muscle strain  Smoking     Discharge Instructions      Take the naproxen twice a day as needed for pain.  You can also take Tylenol as needed for pain.   You can use heat, ice, or alternate between heat and ice for comfort.  You may also try lidocaine patches, IcyHot, Biofreeze, Aspercreme, or Voltaren gel as needed for pain relief.  It is recommended that you  quit smoking.  Return or go to the Emergency Department if symptoms worsen or do not  improve in the next few days.      ED Prescriptions     Medication Sig Dispense Auth. Provider   naproxen (NAPROSYN) 500 MG tablet Take 1 tablet (500 mg total) by mouth 2 (two) times daily. 30 tablet Ivette Loyal, NP      PDMP not reviewed this encounter.   Ivette Loyal, NP 12/11/20 [phone]

## 2021-11-25 ENCOUNTER — Ambulatory Visit (HOSPITAL_COMMUNITY)
Admission: EM | Admit: 2021-11-25 | Discharge: 2021-11-25 | Disposition: A | Discharge status: Home / Self Care | Payer: Medicaid Other | Attending: Family Medicine | Admitting: Family Medicine

## 2021-11-25 ENCOUNTER — Other Ambulatory Visit: Payer: Self-pay

## 2021-11-25 ENCOUNTER — Encounter (HOSPITAL_COMMUNITY): Payer: Self-pay | Admitting: Emergency Medicine

## 2021-11-25 DIAGNOSIS — R059 Cough, unspecified: Secondary | ICD-10-CM | POA: Diagnosis present

## 2021-11-25 DIAGNOSIS — J069 Acute upper respiratory infection, unspecified: Secondary | ICD-10-CM | POA: Diagnosis not present

## 2021-11-25 DIAGNOSIS — Z20822 Contact with and (suspected) exposure to covid-19: Secondary | ICD-10-CM | POA: Diagnosis not present

## 2021-11-25 MED ORDER — PROMETHAZINE-DM 6.25-15 MG/5ML PO SYRP: 5.0000 mL | ORAL_SOLUTION | Freq: Four times a day (QID) | ORAL | 0 refills | Status: AC | PRN

## 2021-11-25 NOTE — ED Triage Notes (Signed)
One day ago, patient started with cough, runny nose, coughing episodes, chills

## 2021-11-25 NOTE — ED Provider Notes (Signed)
  [facility]   [id number] 11/25/21 Arrival Time: 1825  ASSESSMENT & PLAN:  1. Viral URI with cough    Discussed typical duration of viral illnesses. COVID testing sent. OTC symptom care as needed. Work note provided.  New Prescriptions   PROMETHAZINE-DEXTROMETHORPHAN (PROMETHAZINE-DM) 6.25-15 MG/5ML SYRUP    Take 5 mLs by mouth 4 (four) times daily as needed for cough.     Follow-up Information     [facility].   Specialty: Urgent Care Why: If worsening or failing to improve as anticipated. Contact information: [address] [place] [address] [phone]                Reviewed expectations re: course of current medical issues. Questions answered. Outlined signs and symptoms indicating need for more acute intervention. Understanding verbalized. After Visit Summary given.   SUBJECTIVE: History from: Patient. Jamie Browning is a 46 y.o. female. Reports: nasal congestion; cough; fatigue/chills; abrupt onset past 24 hours. Denies: difficulty breathing. Normal PO intake without n/v/d.  OBJECTIVE:  Vitals:   11/25/21 1916  BP: 128/83  Pulse: 83  Resp: 18  Temp: 99.1 F (37.3 C)  TempSrc: Oral  SpO2: 99%    General appearance: alert; no distress Eyes: PERRLA; EOMI; conjunctiva normal HENT: [place]; AT; with nasal congestion Neck: supple  Lungs: speaks full sentences without difficulty; unlabored; dry cough Extremities: no edema Skin: warm and dry Neurologic: normal gait Psychological: alert and cooperative; normal mood and affect  Labs:  Labs Reviewed  SARS CORONAVIRUS 2 (TAT 6-24 HRS)    No Known Allergies  Past Medical History:  Diagnosis Date   Genital herpes    Headache(784.0)    Trichomonas vaginitis    Social History   Socioeconomic History   Marital status: Single    Spouse name: Not on file   Number of children: Not on file   Years of education: Not on file   Highest education  level: Not on file  Occupational History   Not on file  Tobacco Use   Smoking status: Some Days    Types: Cigars   Smokeless tobacco: Never  Vaping Use   Vaping Use: Never used  Substance and Sexual Activity   Alcohol use: Yes    Comment: occasional   Drug use: No   Sexual activity: Yes    Birth control/protection: Injection, None  Other Topics Concern   Not on file  Social History Narrative   Not on file   Social Determinants of Health   Financial Resource Strain: Not on file  Food Insecurity: Not on file  Transportation Needs: Not on file  Physical Activity: Not on file  Stress: Not on file  Social Connections: Not on file  Intimate Partner Violence: Not on file   Family History  Problem Relation Age of Onset   Healthy Mother    Healthy Father    Past Surgical History:  Procedure Laterality Date   DILATION AND CURETTAGE OF UTERUS       Mardella Layman, MD 11/25/21 1931

## 2021-11-26 LAB — SARS CORONAVIRUS 2 (TAT 6-24 HRS): SARS Coronavirus 2: NEGATIVE

## 2022-09-04 ENCOUNTER — Ambulatory Visit (HOSPITAL_COMMUNITY): Payer: Medicaid Other
